# Patient Record
Sex: Male | Born: 1995 | Race: Black or African American | Hispanic: No | Marital: Single | Smoking: Never smoker
Health system: Southern US, Community
[De-identification: ages and names within clinical notes are randomized; demographics above are authoritative.]

## PROBLEM LIST (undated history)

## (undated) DIAGNOSIS — Z5189 Encounter for other specified aftercare: Secondary | ICD-10-CM

## (undated) DIAGNOSIS — R0902 Hypoxemia: Secondary | ICD-10-CM

## (undated) DIAGNOSIS — J45909 Unspecified asthma, uncomplicated: Secondary | ICD-10-CM

## (undated) DIAGNOSIS — G4730 Sleep apnea, unspecified: Secondary | ICD-10-CM

## (undated) DIAGNOSIS — T7840XA Allergy, unspecified, initial encounter: Secondary | ICD-10-CM

## (undated) HISTORY — DX: Encounter for other specified aftercare: Z51.89

## (undated) HISTORY — DX: Allergy, unspecified, initial encounter: T78.40XA

## (undated) HISTORY — DX: Unspecified asthma, uncomplicated: J45.909

## (undated) HISTORY — DX: Sleep apnea, unspecified: G47.30

## (undated) HISTORY — PX: ASD REPAIR: SHX258

## (undated) HISTORY — PX: LUNG BIOPSY: SHX232

## (undated) HISTORY — DX: Hypoxemia: R09.02

---
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
## 2015-01-18 DIAGNOSIS — G4733 Obstructive sleep apnea (adult) (pediatric): Secondary | ICD-10-CM | POA: Insufficient documentation

## 2015-01-18 DIAGNOSIS — R0609 Other forms of dyspnea: Secondary | ICD-10-CM | POA: Insufficient documentation

## 2015-01-18 DIAGNOSIS — R0600 Dyspnea, unspecified: Secondary | ICD-10-CM | POA: Insufficient documentation

## 2015-08-05 ENCOUNTER — Other Ambulatory Visit
Admission: RE | Admit: 2015-08-05 | Discharge: 2015-08-05 | Disposition: A | Discharge status: Home / Self Care | Payer: Medicaid Other | Source: Ambulatory Visit | Attending: Family Medicine | Admitting: Family Medicine

## 2015-08-05 DIAGNOSIS — R888 Abnormal findings in other body fluids and substances: Secondary | ICD-10-CM | POA: Diagnosis not present

## 2015-08-05 LAB — TSH: TSH: 3.59 u[IU]/mL (ref 0.350–4.500)

## 2015-08-06 LAB — T4: T4 TOTAL: 6.3 ug/dL (ref 4.5–12.0)

## 2015-11-22 ENCOUNTER — Other Ambulatory Visit
Admission: RE | Admit: 2015-11-22 | Discharge: 2015-11-22 | Disposition: A | Discharge status: Home / Self Care | Payer: Medicaid Other | Source: Ambulatory Visit | Attending: Family Medicine | Admitting: Family Medicine

## 2015-11-22 DIAGNOSIS — Z201 Contact with and (suspected) exposure to tuberculosis: Secondary | ICD-10-CM | POA: Diagnosis present

## 2015-11-25 LAB — QUANTIFERON IN TUBE
QFT TB AG MINUS NIL VALUE: <0 IU/mL
QUANTIFERON NIL VALUE: 0.05 [IU]/mL
QUANTIFERON TB AG VALUE: 0.04 [IU]/mL
QUANTIFERON TB GOLD: NEGATIVE

## 2015-11-25 LAB — QUANTIFERON TB GOLD ASSAY (BLOOD)

## 2016-05-28 IMAGING — CR DG CERVICAL SPINE WITH FLEX & EXTEND
1 series · 8 of 8 positions shown · non-contrast
Comparison: Cervical spine series of July 12, 2008

CLINICAL DATA: Down syndrome, evaluate for atlantoaxial
instability, chronic neck pain

EXAM:
CERVICAL SPINE COMPLETE WITH FLEXION AND EXTENSION VIEWS

[Series 1: dg cervical spine with flex & extend · 0.14mm/px · 8 of 8 slices shown]
[im 1/8]
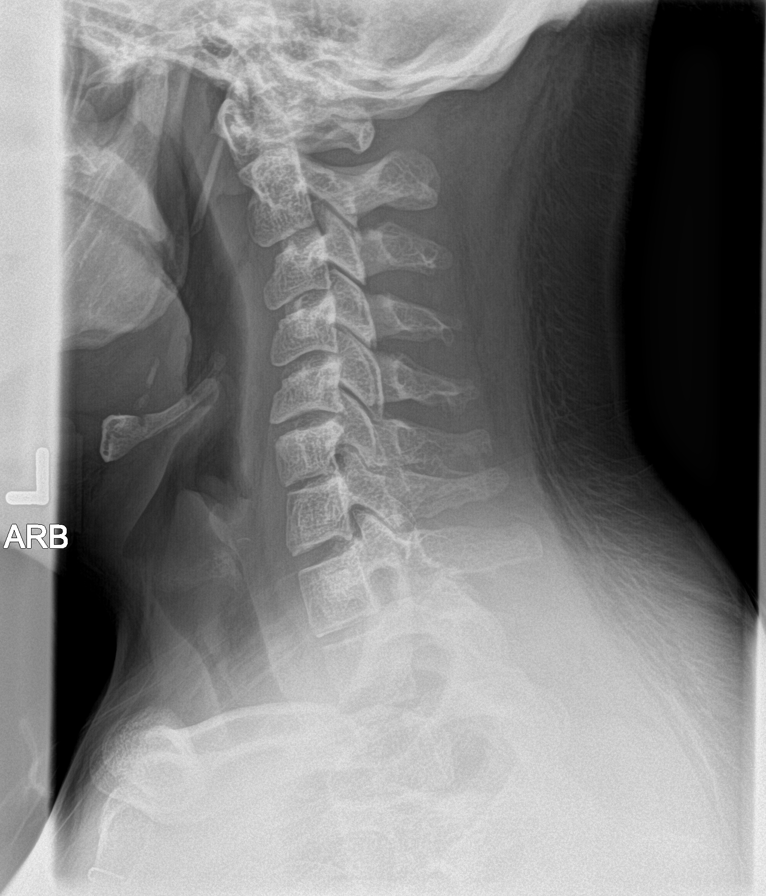
[im 2/8]
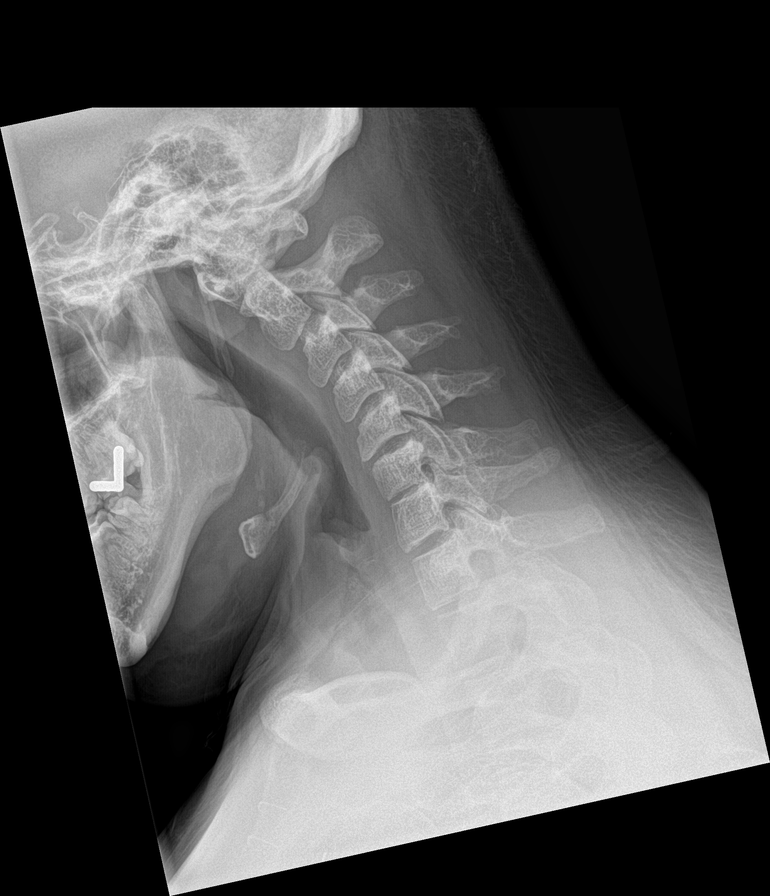
[im 3/8]
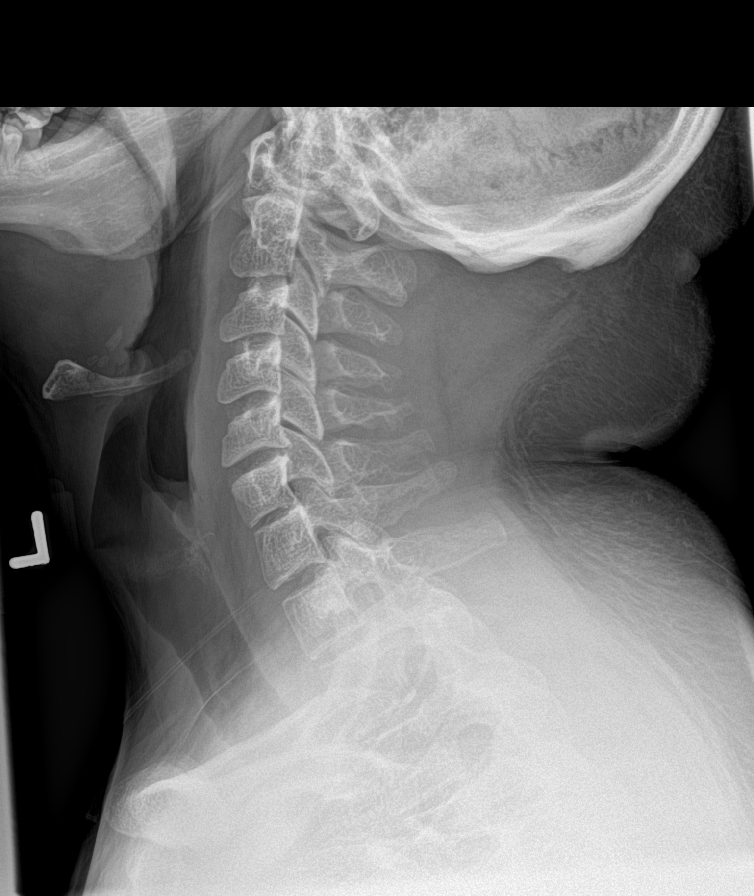
[im 4/8]
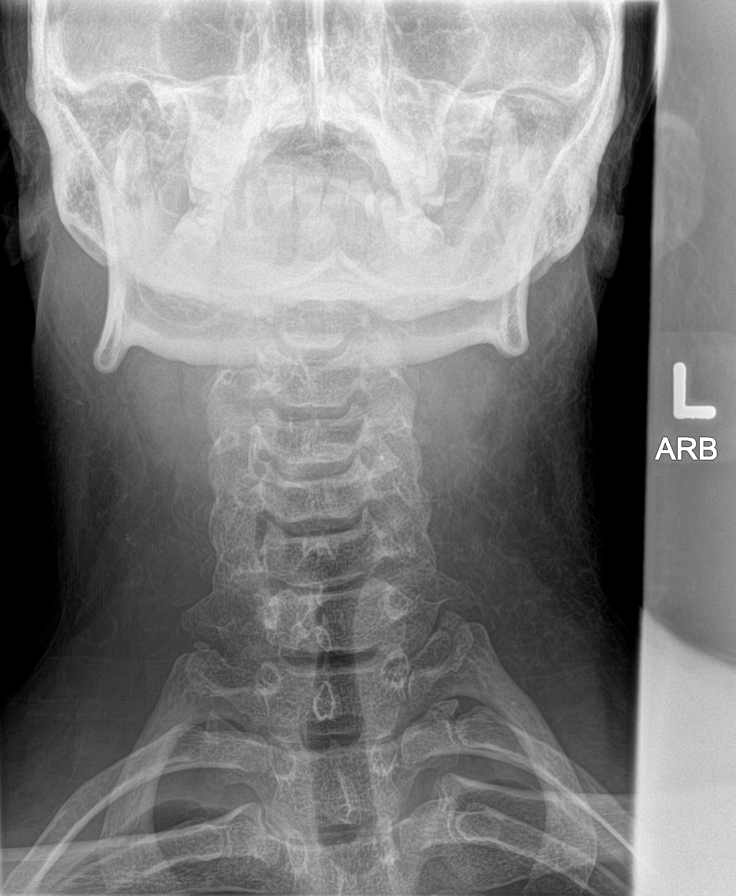
[im 5/8]
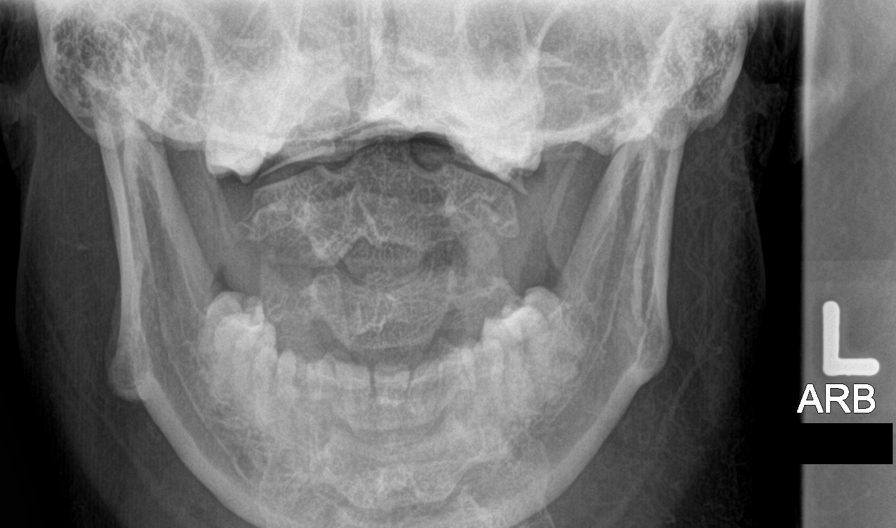
[im 6/8]
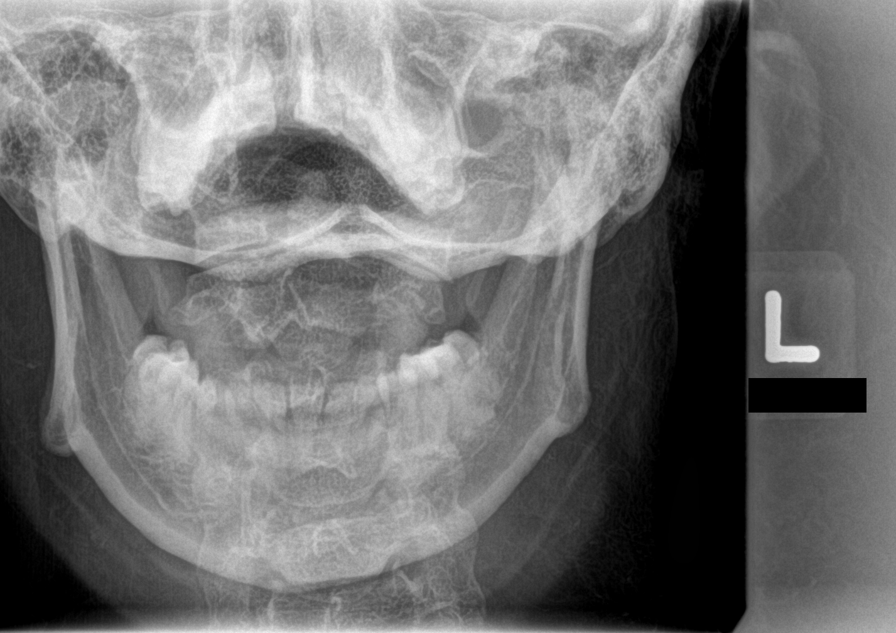
[im 7/8]
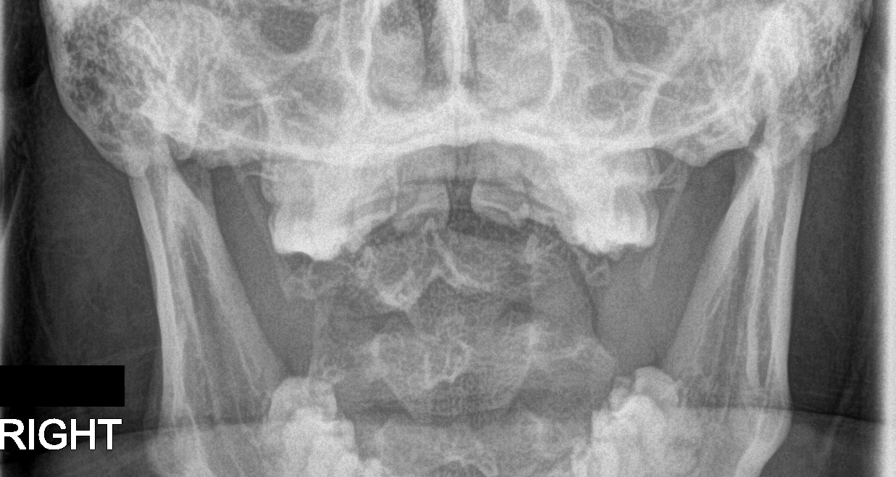
[im 8/8]
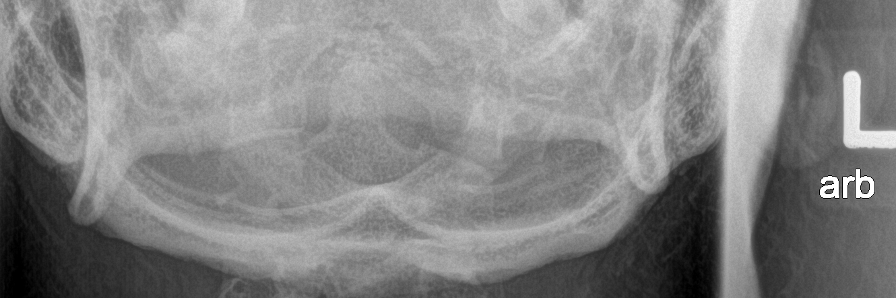

[8 of 8 positions shown; findings below may reference images not displayed]

FINDINGS: The cervical vertebral bodies are preserved in height. There is mild
disc space narrowing at C6-7. The prevertebral soft tissue spaces
are normal. There is no perched facet or spinous process fracture.
The odontoid is intact. As the patient moves from the neutral to the
flexed and to the extended position the atlanto dens interval is
stable.
IMPRESSION: 1. There is no evidence of atlantoaxial instability.
2. Mild chronic narrowing of the C6-7 disc space.
3. The vertebral bodies are preserved in height.

## 2019-11-11 ENCOUNTER — Other Ambulatory Visit: Payer: Self-pay | Admitting: Dermatology

## 2019-11-12 ENCOUNTER — Other Ambulatory Visit: Payer: Self-pay | Admitting: Family

## 2019-11-12 DIAGNOSIS — M542 Cervicalgia: Secondary | ICD-10-CM

## 2019-11-16 ENCOUNTER — Ambulatory Visit
Admission: RE | Admit: 2019-11-16 | Discharge: 2019-11-16 | Disposition: A | Discharge status: Home / Self Care | Payer: Medicaid Other | Source: Ambulatory Visit | Attending: Family | Admitting: Family

## 2019-11-16 ENCOUNTER — Ambulatory Visit
Admission: RE | Admit: 2019-11-16 | Discharge: 2019-11-16 | Disposition: A | Discharge status: Home / Self Care | Payer: Medicaid Other | Attending: Family | Admitting: Family

## 2019-11-16 ENCOUNTER — Other Ambulatory Visit: Payer: Self-pay

## 2019-11-16 DIAGNOSIS — M542 Cervicalgia: Secondary | ICD-10-CM | POA: Insufficient documentation

## 2020-02-29 NOTE — Progress Notes (Signed)
New patient visit   Patient: Kerry Graves   DOB: [birth year]   24 y.o. Male  MRN: [id number] Visit Date: 03/01/2020  Today's healthcare provider: Vernie Murders, PA   No chief complaint on file.  Subjective    Kerry Graves is a 24 y.o. male who presents today as a new patient to establish care.    Patient also has a rash or warts on his penis.  Past Medical History:  Diagnosis Date   Allergy    Asthma    Blood transfusion without reported diagnosis    Oxygen deficiency    Sleep apnea    There are no problems to display for this patient.  Family Status  Relation Name Status   Mother  (Not Specified)   Family History  Problem Relation Age of Onset   Hypertension Mother    Social History   Socioeconomic History   Marital status: Single    Spouse name: Not on file   Number of children: Not on file   Years of education: Not on file   Highest education level: Not on file  Occupational History   Not on file  Tobacco Use   Smoking status: Never Smoker  Substance and Sexual Activity   Alcohol use: Yes   Drug use: Yes   Sexual activity: Never  Other Topics Concern   Not on file  Social History Narrative   Not on file   Social Determinants of Health   Financial Resource Strain:    Difficulty of Paying Living Expenses: Not on file  Food Insecurity:    Worried About [place] in the Last Year: Not on file   Ran Out of Food in the Last Year: Not on file  Transportation Needs:    Lack of Transportation (Medical): Not on file   Lack of Transportation (Non-Medical): Not on file  Physical Activity:    Days of Exercise per Week: Not on file   Minutes of Exercise per Session: Not on file  Stress:    Feeling of Stress : Not on file  Social Connections:    Frequency of Communication with Friends and Family: Not on file   Frequency of Social Gatherings with Friends and Family: Not on file   Attends Religious  Services: Not on file   Active Member of Clubs or Organizations: Not on file   Attends Archivist Meetings: Not on file   Marital Status: Not on file   Outpatient Medications Prior to Visit  Medication Sig   albuterol (ACCUNEB) 0.63 MG/3ML nebulizer solution Take 1 ampule by nebulization every 6 (six) hours as needed for wheezing.   allopurinol (ZYLOPRIM) 100 MG tablet Take 100 mg by mouth daily.   budesonide (PULMICORT) 0.5 MG/2ML nebulizer solution Take 0.5 mg by nebulization 2 (two) times daily.   Cetirizine HCl 10 MG CAPS Take by mouth.   Fluocinolone Acetonide Body (DERMA-SMOOTHE/FS BODY) 0.01 % OIL Apply topically.   ketoconazole (NIZORAL) 2 % shampoo Apply 1 application topically 2 (two) times a week.   No facility-administered medications prior to visit.   Not on File  Immunization History  Administered Date(s) Administered   Moderna SARS-COVID-2 Vaccination 04/16/2019, 05/15/2019    Health Maintenance  Topic Date Due   Hepatitis C Screening  Never done   HIV Screening  Never done   TETANUS/TDAP  Never done   INFLUENZA VACCINE  Never done   COVID-19 Vaccine  Completed    Patient Care  Team: Yudit Modesitt, Vickki Muff, PA as PCP - General (Family Medicine)  Review of Systems  Constitutional: Negative.   HENT: Negative.   Eyes: Negative.   Respiratory: Negative.   Cardiovascular: Negative.   Gastrointestinal: Negative.   Endocrine: Negative.   Genitourinary: Negative.   Musculoskeletal: Negative.   Skin: Positive for rash.  Allergic/Immunologic: Positive for environmental allergies.  Neurological: Negative.   Hematological: Negative.   Psychiatric/Behavioral: Negative.       Objective    BP (!) 85/61 (BP Location: Right Arm, Patient Position: Sitting, Cuff Size: Normal)    Pulse 88    Temp 98.3 F (36.8 C) (Oral)    Wt 201 lb (91.2 kg)    SpO2 98%  Physical Exam Constitutional:      General: He is not in acute distress.    Appearance:  He is well-developed. He is obese.     Comments: Down's Syndrome congenital changes.  HENT:     Head: Normocephalic and atraumatic.     Right Ear: Hearing normal.     Left Ear: Hearing normal.     Nose: Nose normal.  Eyes:     General: Lids are normal. No scleral icterus.       Right eye: No discharge.        Left eye: No discharge.     Conjunctiva/sclera: Conjunctivae normal.  Cardiovascular:     Rate and Rhythm: Normal rate and regular rhythm.     Pulses: Normal pulses.     Heart sounds: Normal heart sounds.  Pulmonary:     Effort: Pulmonary effort is normal. No respiratory distress.     Breath sounds: Normal breath sounds.  Genitourinary:    Comments: Hard white crusting lesions on the shaft of the penis and around the glans. No local lymphadenopathy or urethral discharge. Musculoskeletal:        General: Normal range of motion.     Cervical back: Neck supple.  Skin:    Findings: No lesion or rash.     Comments: White-silvery raised lesion on the extensor surface of the left knee.  Neurological:     Mental Status: He is alert and oriented to person, place, and time.  Psychiatric:        Speech: Speech normal.        Behavior: Behavior normal.        Thought Content: Thought content normal.      Depression Screen No flowsheet data found. No results found for any visits on 03/01/20.  Assessment & Plan     1. Neoplasm, uncertain whether benign or malignant Lesions on the penis that are hard and questionable psoriatic versus squamous cell lesions. Schedule dermatology referral. - Ambulatory referral to Dermatology  2. Psoriasis Psoriasis of the left knee. Schedule dermatology referral. - Ambulatory referral to Dermatology  3. Obstructive sleep apnea Diagnosed several years ago and unable to tolerate CPAP. Controls oxygen saturation by use of O2 at 0.5 LPM at night by cannula. No significant snoring now.  4. 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  5. History of atrial septal defect Surgical repair of atrial septal defect at age 73-3. No murmur today.   No follow-ups on file.     Andres Shad, PA, have reviewed all documentation for this visit. The documentation on 03/01/20 for the exam, diagnosis, procedures, and orders are all accurate and complete.    Vernie Murders,  [place] [phone] (phone) [phone] (fax)  [place]

## 2020-03-01 ENCOUNTER — Other Ambulatory Visit: Payer: Self-pay

## 2020-03-01 ENCOUNTER — Encounter: Payer: Self-pay | Admitting: Family Medicine

## 2020-03-01 ENCOUNTER — Ambulatory Visit (INDEPENDENT_AMBULATORY_CARE_PROVIDER_SITE_OTHER): Payer: Medicaid Other | Admitting: Family Medicine

## 2020-03-01 VITALS — BP 85/61 | HR 88 | Temp 98.3°F | Wt 201.0 lb

## 2020-03-01 DIAGNOSIS — L409 Psoriasis, unspecified: Secondary | ICD-10-CM | POA: Diagnosis not present

## 2020-03-01 DIAGNOSIS — Z8679 Personal history of other diseases of the circulatory system: Secondary | ICD-10-CM

## 2020-03-01 DIAGNOSIS — D489 Neoplasm of uncertain behavior, unspecified: Secondary | ICD-10-CM

## 2020-03-01 DIAGNOSIS — Q909 Down syndrome, unspecified: Secondary | ICD-10-CM

## 2020-03-01 DIAGNOSIS — G4733 Obstructive sleep apnea (adult) (pediatric): Secondary | ICD-10-CM

## 2020-03-16 ENCOUNTER — Ambulatory Visit (INDEPENDENT_AMBULATORY_CARE_PROVIDER_SITE_OTHER): Payer: Medicaid Other | Admitting: Dermatology

## 2020-03-16 ENCOUNTER — Other Ambulatory Visit: Payer: Self-pay

## 2020-03-16 DIAGNOSIS — L409 Psoriasis, unspecified: Secondary | ICD-10-CM

## 2020-03-16 MED ORDER — CLOBETASOL PROPIONATE 0.05 % EX OINT: TOPICAL_OINTMENT | CUTANEOUS | 1 refills | Status: DC

## 2020-03-16 MED ORDER — FLUOCINOLONE ACETONIDE BODY 0.01 % EX OIL: TOPICAL_OIL | CUTANEOUS | 2 refills | Status: DC

## 2020-03-16 MED ORDER — TACROLIMUS 0.1 % EX OINT: TOPICAL_OINTMENT | CUTANEOUS | 1 refills | Status: DC

## 2020-03-16 MED ORDER — KETOCONAZOLE 2 % EX SHAM: MEDICATED_SHAMPOO | CUTANEOUS | 2 refills | Status: DC

## 2020-03-16 NOTE — Patient Instructions (Signed)
Topical steroids (such as triamcinolone, fluocinolone, fluocinonide, mometasone, clobetasol, halobetasol, betamethasone, hydrocortisone) can cause thinning and lightening of the skin if they are used for too long in the same area. Your physician has selected the right strength medicine for your problem and area affected on the body. Please use your medication only as directed by your physician to prevent side effects.   . 

## 2020-03-16 NOTE — Progress Notes (Signed)
   Follow-Up Visit   Subjective  Kerry Graves is a 24 y.o. male who presents for the following: skin check. Patient has scaling on his right knee present a couple of months. Not itchy, not painful. He also has scaling in ears and scalp. He has used ketoconazole shampoo and Derma-Smoothe oil, but don't help much.  He also has discoloration on the penis. His nurse that helps bathe him noticed it 1 1/2 weeks ago.  He does not have joint pain.    The following portions of the chart were reviewed this encounter and updated as appropriate:      Review of Systems:  No other skin or systemic complaints except as noted in HPI or Assessment and Plan.  Objective  Well appearing patient in no apparent distress; mood and affect are within normal limits.  A focused examination was performed including face, groin, knee. Relevant physical exam findings are noted in the Assessment and Plan.  Objective  Left Knee, glans penis, scalp, ears: Well-demarcated erythematous papules/plaques with silvery scale. Diffuse scaling on the scalp, ears.   Assessment & Plan  Psoriasis Left Knee, glans penis, scalp, ears  Plan to start [employer] pending labs and approval. Will send Rx to [place].   Start tacrolimus ointment BID to penis until improved.  Start clobetasol ointment QD/BID to affected area knee. Avoid face, groin, axilla.  Continue ketoconazole 2% shampoo massage into scalp and let sit several minutes before rinsing.  Continue Derma-Smoothe oil Apply to affected areas scalp, body qd/bid.  Topical steroids (such as triamcinolone, fluocinolone, fluocinonide, mometasone, clobetasol, halobetasol, betamethasone, hydrocortisone) can cause thinning and lightening of the skin if they are used for too long in the same area. Your physician has selected the right strength medicine for your problem and area affected on the body. Please use your medication only as directed by your physician to prevent side  effects.   Reviewed risks of biologics including immunosuppression, infections, injection site reaction, and failure to improve condition. Goal is control of skin condition, not cure.  Some older biologics such as Humira and Enbrel may slightly increase risk of malignancy and may worsen congestive heart failure. The use of biologics requires long term medication management, including periodic office visits and monitoring of blood work.   tacrolimus (PROTOPIC) 0.1 % ointment - Left Knee, glans penis, scalp, ears  clobetasol ointment (TEMOVATE) 0.05 % - Left Knee, glans penis, scalp, ears  ketoconazole (NIZORAL) 2 % shampoo - Left Knee, glans penis, scalp, ears  Fluocinolone Acetonide Body 0.01 % OIL - Left Knee, glans penis, scalp, ears  Other Related Procedures Comprehensive metabolic panel CBC with Differential/Platelet Hepatitis B surface antibody,qualitative Hepatitis B surface antigen Hepatitis C antibody HIV Antibody (routine testing w rflx) QuantiFERON-TB Gold Plus  Return in about 6 weeks (around 04/27/2020) for psoriasis.   IJamesetta Orleans, CMA, am acting as scribe for Brendolyn Patty, MD .  Documentation: I have reviewed the above documentation for accuracy and completeness, and I agree with the above.  Brendolyn Patty MD

## 2020-03-17 ENCOUNTER — Other Ambulatory Visit: Payer: Self-pay

## 2020-03-17 MED ORDER — PROTOPIC 0.1 % EX OINT: TOPICAL_OINTMENT | Freq: Two times a day (BID) | CUTANEOUS | 1 refills | Status: DC

## 2020-03-17 MED ORDER — DERMA-SMOOTHE/FS BODY 0.01 % EX OIL: 1.0000 "application " | TOPICAL_OIL | CUTANEOUS | 2 refills | Status: DC

## 2020-03-17 NOTE — Progress Notes (Signed)
RXs sent in as DAW for insurance purposes.

## 2020-03-18 LAB — COMPREHENSIVE METABOLIC PANEL
ALT: 22 IU/L (ref 0–44)
AST: 18 IU/L (ref 0–40)
Albumin/Globulin Ratio: 1.2 (ref 1.2–2.2)
Albumin: 4.3 g/dL (ref 4.1–5.2)
Alkaline Phosphatase: 74 IU/L (ref 44–121)
BUN/Creatinine Ratio: 11 (ref 9–20)
BUN: 13 mg/dL (ref 6–20)
Bilirubin Total: 0.8 mg/dL (ref 0.0–1.2)
CO2: 26 mmol/L (ref 20–29)
Calcium: 9.3 mg/dL (ref 8.7–10.2)
Chloride: 99 mmol/L (ref 96–106)
Creatinine, Ser: 1.15 mg/dL (ref 0.76–1.27)
GFR calc Af Amer: 102 mL/min/{1.73_m2} (ref >59)
GFR calc non Af Amer: 89 mL/min/{1.73_m2} (ref >59)
Globulin, Total: 3.5 g/dL (ref 1.5–4.5)
Glucose: 77 mg/dL (ref 65–99)
Potassium: 4.1 mmol/L (ref 3.5–5.2)
Sodium: 139 mmol/L (ref 134–144)
Total Protein: 7.8 g/dL (ref 6.0–8.5)

## 2020-03-18 LAB — HEPATITIS B SURFACE ANTIBODY,QUALITATIVE: Hep B Surface Ab, Qual: NONREACTIVE

## 2020-03-18 LAB — CBC WITH DIFFERENTIAL/PLATELET
Basophils Absolute: 0 10*3/uL (ref 0.0–0.2)
Basos: 0 %
EOS (ABSOLUTE): 0 10*3/uL (ref 0.0–0.4)
Eos: 0 %
Hematocrit: 51.1 % — ABNORMAL HIGH (ref 37.5–51.0)
Hemoglobin: 17.5 g/dL (ref 13.0–17.7)
Immature Grans (Abs): 0 10*3/uL (ref 0.0–0.1)
Immature Granulocytes: 0 %
Lymphocytes Absolute: 2.6 10*3/uL (ref 0.7–3.1)
Lymphs: 51 %
MCH: 31.8 pg (ref 26.6–33.0)
MCHC: 34.2 g/dL (ref 31.5–35.7)
MCV: 93 fL (ref 79–97)
Monocytes Absolute: 0.5 10*3/uL (ref 0.1–0.9)
Monocytes: 10 %
Neutrophils Absolute: 2 10*3/uL (ref 1.4–7.0)
Neutrophils: 39 %
Platelets: 212 10*3/uL (ref 150–450)
RBC: 5.51 x10E6/uL (ref 4.14–5.80)
RDW: 13.6 % (ref 11.6–15.4)
WBC: 5.1 10*3/uL (ref 3.4–10.8)

## 2020-03-18 LAB — QUANTIFERON-TB GOLD PLUS
QuantiFERON Mitogen Value: >10 IU/mL
QuantiFERON Nil Value: 0.09 IU/mL
QuantiFERON TB1 Ag Value: 0.07 IU/mL
QuantiFERON TB2 Ag Value: 0.09 IU/mL
QuantiFERON-TB Gold Plus: NEGATIVE

## 2020-03-18 LAB — HEPATITIS C ANTIBODY: Hep C Virus Ab: 0.3 s/co ratio (ref 0.0–0.9)

## 2020-03-18 LAB — HIV ANTIBODY (ROUTINE TESTING W REFLEX): HIV Screen 4th Generation wRfx: NONREACTIVE

## 2020-03-18 LAB — HEPATITIS B SURFACE ANTIGEN: Hepatitis B Surface Ag: NEGATIVE

## 2020-03-21 ENCOUNTER — Telehealth: Payer: Self-pay

## 2020-03-21 MED ORDER — SKYRIZI PEN 150 MG/ML ~~LOC~~ SOAJ: 150.0000 mg | SUBCUTANEOUS | 1 refills | Status: DC

## 2020-03-21 MED ORDER — SKYRIZI PEN 150 MG/ML ~~LOC~~ SOAJ: 150.0000 mg | SUBCUTANEOUS | 3 refills | Status: DC

## 2020-03-21 NOTE — Telephone Encounter (Signed)
-----   Message from Brendolyn Patty, MD sent at 03/20/2020  8:40 PM EST ----- Labs ok, Hep panel/HIV/TB negative Ok to send in Rx for [employer]

## 2020-03-21 NOTE — Telephone Encounter (Signed)
Advised pts mother of lab results.  Advisd we would send in [place] to [facility]

## 2020-03-23 ENCOUNTER — Telehealth: Payer: Self-pay

## 2020-03-23 NOTE — Telephone Encounter (Signed)
Patient's Orson Ape will not be approved due to [employer]. Patient will have to try and fail Humira, Enbrel or Cosentyx first. Spoke with Darrick Penna at [facility] and she states there is no back and forth with [facility] unfortunately.

## 2020-03-24 ENCOUNTER — Other Ambulatory Visit: Payer: Self-pay

## 2020-03-24 MED ORDER — PROTOPIC 0.1 % EX OINT: TOPICAL_OINTMENT | Freq: Two times a day (BID) | CUTANEOUS | 1 refills | Status: DC

## 2020-03-24 NOTE — Telephone Encounter (Signed)
I would prefer Cosentyx of those three choices.  But it requires more frequent injections (maintenance is 2 shots every month).  Can you contact mother to see if he would be okay with that dosing schedule?  If so, we can proceed with the Cosentyx

## 2020-03-29 NOTE — Telephone Encounter (Signed)
Left patient's mother message to return my call.

## 2020-04-13 NOTE — Telephone Encounter (Signed)
Called mom and spoke with her today but she could not talk at the time due to patient possibly having a positive COVId test.

## 2020-05-03 ENCOUNTER — Ambulatory Visit: Payer: Medicaid Other | Admitting: Dermatology

## 2020-06-03 ENCOUNTER — Encounter: Payer: Self-pay | Admitting: Adult Health

## 2020-06-03 ENCOUNTER — Ambulatory Visit (INDEPENDENT_AMBULATORY_CARE_PROVIDER_SITE_OTHER): Payer: Medicaid Other | Admitting: Adult Health

## 2020-06-03 ENCOUNTER — Other Ambulatory Visit: Payer: Self-pay

## 2020-06-03 ENCOUNTER — Ambulatory Visit
Admission: RE | Admit: 2020-06-03 | Discharge: 2020-06-03 | Disposition: A | Discharge status: Home / Self Care | Payer: Medicaid Other | Attending: Adult Health | Admitting: Adult Health

## 2020-06-03 ENCOUNTER — Ambulatory Visit
Admission: RE | Admit: 2020-06-03 | Discharge: 2020-06-03 | Disposition: A | Discharge status: Home / Self Care | Payer: Medicaid Other | Source: Ambulatory Visit | Attending: Adult Health | Admitting: Adult Health

## 2020-06-03 DIAGNOSIS — M47812 Spondylosis without myelopathy or radiculopathy, cervical region: Secondary | ICD-10-CM | POA: Diagnosis not present

## 2020-06-03 DIAGNOSIS — R0600 Dyspnea, unspecified: Secondary | ICD-10-CM

## 2020-06-03 DIAGNOSIS — L409 Psoriasis, unspecified: Secondary | ICD-10-CM | POA: Diagnosis not present

## 2020-06-03 DIAGNOSIS — M542 Cervicalgia: Secondary | ICD-10-CM | POA: Diagnosis present

## 2020-06-03 DIAGNOSIS — Q909 Down syndrome, unspecified: Secondary | ICD-10-CM | POA: Diagnosis not present

## 2020-06-03 DIAGNOSIS — I959 Hypotension, unspecified: Secondary | ICD-10-CM

## 2020-06-03 DIAGNOSIS — R0609 Other forms of dyspnea: Secondary | ICD-10-CM

## 2020-06-03 DIAGNOSIS — Q212 Atrioventricular septal defect: Secondary | ICD-10-CM

## 2020-06-03 DIAGNOSIS — Q2121 Partial atrioventricular septal defect: Secondary | ICD-10-CM

## 2020-06-03 DIAGNOSIS — Z87898 Personal history of other specified conditions: Secondary | ICD-10-CM | POA: Insufficient documentation

## 2020-06-03 MED ORDER — BUDESONIDE 0.5 MG/2ML IN SUSP: 0.5000 mg | Freq: Two times a day (BID) | RESPIRATORY_TRACT | 0 refills | Status: DC
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
## 2020-06-06 ENCOUNTER — Other Ambulatory Visit: Payer: Self-pay

## 2020-06-06 ENCOUNTER — Encounter: Payer: Self-pay | Admitting: Cardiology

## 2020-06-06 ENCOUNTER — Ambulatory Visit (INDEPENDENT_AMBULATORY_CARE_PROVIDER_SITE_OTHER): Payer: Medicaid Other | Admitting: Cardiology

## 2020-06-06 VITALS — BP 98/60 | HR 54 | Wt 200.0 lb

## 2020-06-06 DIAGNOSIS — Q909 Down syndrome, unspecified: Secondary | ICD-10-CM

## 2020-06-06 DIAGNOSIS — Q212 Atrioventricular septal defect: Secondary | ICD-10-CM

## 2020-06-06 DIAGNOSIS — Q2121 Partial atrioventricular septal defect: Secondary | ICD-10-CM

## 2020-06-06 NOTE — Patient Instructions (Signed)
Medication Instructions:  Your physician recommends that you continue on your current medications as directed. Please refer to the Current Medication list given to you today.  *If you need a refill on your cardiac medications before your next appointment, please call your pharmacy*  Follow-Up: At [facility], you and your health needs are our priority.  As part of our continuing mission to provide you with exceptional heart care, we have created designated Provider Care Teams.  These Care Teams include your primary Cardiologist (physician) and Advanced Practice Providers (APPs -  Physician Assistants and Nurse Practitioners) who all work together to provide you with the care you need, when you need it.  We recommend signing up for the patient portal called "MyChart".  Sign up information is provided on this After Visit Summary.  MyChart is used to connect with patients for Virtual Visits (Telemedicine).  Patients are able to view lab/test results, encounter notes, upcoming appointments, etc.  Non-urgent messages can be sent to your provider as well.   To learn more about what you can do with MyChart, go to [email or web address].    Your next appointment:   As needed.  The format for your next appointment:   In Person  Provider:   Kate Sable, MD

## 2020-06-06 NOTE — Progress Notes (Signed)
Degenerative changes C5- C6 and C6 - C7 with reversal of cervical curve sometimes seen with muscle spasms.  This x ray similar to previous 6 months ago. Ok to refer to orthopedics if wanted.

## 2020-06-06 NOTE — Progress Notes (Signed)
Cardiology Office Note:    Date:  06/06/2020   ID:  Kerry Graves, DOB 03-11-1996, MRN [id number]  PCP:  Chrismon, Dennis E, [place]  Cardiologist:  No primary care provider on file.  Advanced Practice Provider:  No care team member to display Electrophysiologist:  None       Referring MD: Sharmon Leyden*   Chief Complaint  Patient presents with  . New Patient (Initial Visit)    Establish care with provider for DOE, AVSD and hypotension. Medications verbally reviewed with patient and caregiver.    History of Present Illness:    Kerry Graves is a 25 y.o. male with a hx of OSA, asthma, trisomy 21, partial AV septal defect, ASD s/p primary ASD closure who presents to establish care.  He presents today with caretaker.  States having AV canal defect which was repaired around age 34.  Has been okay over the past several years.  Denies any more procedures since then.  Has put on some weight due to lack of exercising since the pandemic.  Patient previously was followed/seen at [facility].  EMR/reports indicate patient has a known cleft of mitral and tricuspid valve.  Last echo 2016 at [facility] showed EF 50%, mild TR, trivial MR.  Patient has no concerns at this time, his blood pressures are usually low normal.  Past Medical History:  Diagnosis Date  . Allergy   . Asthma   . Blood transfusion without reported diagnosis   . Oxygen deficiency   . Sleep apnea     Past Surgical History:  Procedure Laterality Date  . ASD REPAIR    . LUNG BIOPSY      Current Medications: Current Meds  Medication Sig  . albuterol (ACCUNEB) 0.63 MG/3ML nebulizer solution Take 1 ampule by nebulization every 6 (six) hours as needed for wheezing.  Marland Kitchen allopurinol (ZYLOPRIM) 100 MG tablet Take 100 mg by mouth daily.  . budesonide (PULMICORT) 0.5 MG/2ML nebulizer solution Take 2 mLs (0.5 mg total) by nebulization 2 (two) times daily.  . Cetirizine HCl 10 MG  CAPS Take by mouth.  . clobetasol ointment (TEMOVATE) 0.05 % Apply to affected area knee twice daily until improved. Avoid face, groin, underarms.  . DERMA-SMOOTHE/FS BODY 0.01 % OIL Apply 1 application topically as directed. Apply once to twice daily, do not rinse off  . fluticasone (FLONASE) 50 MCG/ACT nasal spray Place 1 spray into both nostrils daily.  Marland Kitchen ketoconazole (NIZORAL) 2 % shampoo Massage into scalp 1-2x/wk, let sit several minutes until improved.  Marland Kitchen omeprazole (PRILOSEC) 20 MG capsule Take 20 mg by mouth daily.  Marland Kitchen PROAIR HFA 108 (90 Base) MCG/ACT inhaler SMARTSIG:1 Puff(s) By Mouth Every 6 Hours PRN  . PROTOPIC 0.1 % ointment Apply topically 2 (two) times daily. To affected area of groin until improved     Allergies:   Patient has no known allergies.   Social History   Socioeconomic History  . Marital status: Single    Spouse name: Not on file  . Number of children: Not on file  . Years of education: Not on file  . Highest education level: Not on file  Occupational History  . Not on file  Tobacco Use  . Smoking status: Never Smoker  . Smokeless tobacco: Never Used  Substance and Sexual Activity  . Alcohol use: Not Currently  . Drug use: Yes  . Sexual activity: Never  Other Topics Concern  . Not on  file  Social History Narrative  . Not on file   Social Determinants of Health   Financial Resource Strain: Not on file  Food Insecurity: Not on file  Transportation Needs: Not on file  Physical Activity: Not on file  Stress: Not on file  Social Connections: Not on file     Family History: The patient's family history includes Hypertension in his mother.  ROS:   Please see the history of present illness.     All other systems reviewed and are negative.  EKGs/Labs/Other Studies Reviewed:    The following studies were reviewed today:   EKG:  EKG is  ordered today.  The ekg ordered today demonstrates sinus bradycardia, PACs  Recent Labs: 03/16/2020: ALT 22;  BUN 13; Creatinine, Ser 1.15; Hemoglobin 17.5; Platelets 212; Potassium 4.1; Sodium 139  Recent Lipid Panel No results found for: CHOL, TRIG, HDL, CHOLHDL, VLDL, LDLCALC, LDLDIRECT   Risk Assessment/Calculations:      Physical Exam:    VS:  BP 98/60 (BP Location: Left Arm, Patient Position: Sitting, Cuff Size: Normal)   Pulse (!) 54   Wt 200 lb (90.7 kg)   SpO2 97%     Wt Readings from Last 3 Encounters:  06/06/20 200 lb (90.7 kg)  06/03/20 203 lb (92.1 kg)  03/01/20 201 lb (91.2 kg)     GEN:  Well nourished, well developed in no acute distress HEENT: Normal NECK: No JVD; No carotid bruits LYMPHATICS: No lymphadenopathy CARDIAC: RRR, no murmurs, distant heart sounds RESPIRATORY: Clear anteriorly, decreased at bases ABDOMEN: Soft, non-tender, non-distended MUSCULOSKELETAL:  No edema; No deformity  SKIN: Warm and dry NEUROLOGIC:  Alert  PSYCHIATRIC:  Normal affect   ASSESSMENT:    1. Atrioventricular septal defect (AVSD), partial   2. Trisomy 21, Down syndrome    PLAN:    In order of problems listed above:  1. Patient with trisomy 56, AV canal defect status post apparent primum ASD repair around age 68.  Was lost to follow-up [facility].  Clinically he does not appear to be in heart failure, no murmurs, edema noted on exam.  Recommend patient reestablish care with [facility] clinic as this is the most skilled for his condition and monitoring the area.  Also advantageous due to continuity of care.  No indication for additional testing at this time.  Repeat echocardiogram can be performed when he reestablishes with [facility] clinic.  Follow-up as needed    Medication Adjustments/Labs and Tests Ordered: Current medicines are reviewed at length with the patient today.  Concerns regarding medicines are outlined above.  Orders Placed This Encounter  Procedures  . EKG 12-Lead   No orders of the defined types were placed in this encounter.   Patient Instructions   Medication Instructions:  Your physician recommends that you continue on your current medications as directed. Please refer to the Current Medication list given to you today.  *If you need a refill on your cardiac medications before your next appointment, please call your pharmacy*  Follow-Up: At [facility], you and your health needs are our priority.  As part of our continuing mission to provide you with exceptional heart care, we have created designated Provider Care Teams.  These Care Teams include your primary Cardiologist (physician) and Advanced Practice Providers (APPs -  Physician Assistants and Nurse Practitioners) who all work together to provide you with the care you need, when you need it.  We recommend signing up for the patient portal called "MyChart".  Sign up information is provided on this After Visit Summary.  MyChart is used to connect with patients for Virtual Visits (Telemedicine).  Patients are able to view lab/test results, encounter notes, upcoming appointments, etc.  Non-urgent messages can be sent to your provider as well.   To learn more about what you can do with MyChart, go to [email or web address].    Your next appointment:   As needed.  The format for your next appointment:   In Person  Provider:   Kate Sable, MD     Signed, Kate Sable, MD  06/06/2020 4:57 PM    [place]

## 2020-06-27 ENCOUNTER — Telehealth: Payer: Self-pay

## 2020-06-27 DIAGNOSIS — L409 Psoriasis, unspecified: Secondary | ICD-10-CM

## 2020-06-27 MED ORDER — OTEZLA 10 & 20 & 30 MG PO TBPK: ORAL_TABLET | ORAL | 0 refills | Status: DC

## 2020-06-27 MED ORDER — OTEZLA 30 MG PO TABS: 30.0000 mg | ORAL_TABLET | Freq: Two times a day (BID) | ORAL | 1 refills | Status: DC

## 2020-06-27 NOTE — Telephone Encounter (Signed)
Spoke with patient's mother. He was not able to get Orson Ape and she states that Dr Nicole Kindred talked to her about an oral treatment at his last appointment if Orson Ape was not covered. I did not see mention of Otezla in his chart. However, there is a note that Dr Nicole Kindred would like him to try Cosentyx and Estill Bamberg tried to reach out to his mother regarding this and she could not talk at the time. The patient did not come to his 6 week follow up appointment scheduled in January 2022. His mother would like to have the oral treatment sent in and she said she would schedule a follow up appointment in the future once he has started on the pills. Please advise.

## 2020-06-27 NOTE — Telephone Encounter (Signed)
Go ahead and send in Otezla 30 mg PO bid, #60.  To [facility]?  Not sure if it is covered either, but we can send it in and see.  He can start with a sample pack if she wants to come by and pick it up.  GI upset frequently occurs when first starting medication and gets better as body gets used to it. The sample pack starts with a lower dose to minimize GI side effects. Side effects of Otezla (apremilast) include diarrhea, nausea, headache, upper respiratory infection, depression, and weight decrease (5-10%). Goal is control of skin condition, not cure.  The use of Rutherford Nail requires long term medication management, including periodic office visits.  Needs follow-up apt 4-6 weeks after starting medication.

## 2020-06-27 NOTE — Telephone Encounter (Signed)
Sent in [place] to [place]. Advised caregiver that may or may not be covered by his insurance. Discussed the risk and side effects of medication and advised that he may have some GI upset when starting medication that should, hopefully, improve as his system gets used to the medication. Advised caregiver if Rutherford Nail is not covered, he will need to try and fail Enbrel, Humira or Cosentyx.

## 2020-07-13 ENCOUNTER — Other Ambulatory Visit: Payer: Self-pay

## 2020-07-13 ENCOUNTER — Ambulatory Visit (INDEPENDENT_AMBULATORY_CARE_PROVIDER_SITE_OTHER): Payer: Medicaid Other | Admitting: Dermatology

## 2020-07-13 DIAGNOSIS — L853 Xerosis cutis: Secondary | ICD-10-CM

## 2020-07-13 DIAGNOSIS — L739 Follicular disorder, unspecified: Secondary | ICD-10-CM

## 2020-07-13 DIAGNOSIS — L409 Psoriasis, unspecified: Secondary | ICD-10-CM

## 2020-07-13 MED ORDER — CLINDAMYCIN PHOSPHATE 1 % EX SOLN: CUTANEOUS | 3 refills | Status: DC

## 2020-07-13 MED ORDER — DERMA-SMOOTHE/FS BODY 0.01 % EX OIL: 1.0000 "application " | TOPICAL_OIL | CUTANEOUS | 3 refills | Status: DC

## 2020-07-13 MED ORDER — CLOBETASOL PROPIONATE 0.05 % EX OINT: TOPICAL_OINTMENT | CUTANEOUS | 2 refills | Status: DC

## 2020-07-13 MED ORDER — CLINDAMYCIN PHOSPHATE 1 % EX LOTN: TOPICAL_LOTION | CUTANEOUS | 3 refills | Status: DC

## 2020-07-13 NOTE — Patient Instructions (Addendum)
Patient will have to try and fail Humira, Enbrel or Cosentyx first (injections) before insurance will pay for Rutherford Nail (pills).   Continue Derma-Smoothe F/S oil 1-2 times a day to scalp/body. Continue Ketoconazole 2% shampoo - massage into scalp and let sit several minutes before rinsing. Continue clobetasol ointment to affected area knee and scalp 1-2 times a day until rash improved. Avoid face, groin, underarms. Continue Protopic (tacrolimus) ointment 1-2 times a day to affected areas groin and behind ears until rash improved.  Start clindamycin lotion - apply to chest and back every day after shower for folliculitis (bumps).  Topical steroids (such as triamcinolone, fluocinolone, fluocinonide, mometasone, clobetasol, halobetasol, betamethasone, hydrocortisone) can cause thinning and lightening of the skin if they are used for too long in the same area. Your physician has selected the right strength medicine for your problem and area affected on the body. Please use your medication only as directed by your physician to prevent side effects.    If you have any questions or concerns for your doctor, please call our main line at [phone] and press option 4 to reach your doctor's medical assistant. If no one answers, please leave a voicemail as directed and we will return your call as soon as possible. Messages left after 4 pm will be answered the following business day.   You may also send [place] a message via [place]. We typically respond to MyChart messages within 1-2 business days.  For prescription refills, please ask your pharmacy to contact our office. Our fax number is [phone].  If you have an urgent issue when the clinic is closed that cannot wait until the next business day, you can page your doctor at the number below.    Please note that while we do our best to be available for urgent issues outside of office hours, we are not available 24/7.   If you have an urgent issue and are  unable to reach [place], you may choose to seek medical care at your doctor's office, retail clinic, urgent care center, or emergency room.  If you have a medical emergency, please immediately call 911 or go to the emergency department.  Pager Numbers  - Dr. Nehemiah Massed: [phone]  - Dr. Laurence Ferrari: [phone]  - Dr. Nicole Kindred: [phone]  In the event of inclement weather, please call our main line at [phone] for an update on the status of any delays or closures.  Dermatology Medication Tips: Please keep the boxes that topical medications come in in order to help keep track of the instructions about where and how to use these. Pharmacies typically print the medication instructions only on the boxes and not directly on the medication tubes.   If your medication is too expensive, please contact our office at [phone] option 4 or send [place] a message through [place].   We are unable to tell what your co-pay for medications will be in advance as this is different depending on your insurance coverage. However, we may be able to find a substitute medication at lower cost or fill out paperwork to get insurance to cover a needed medication.   If a prior authorization is required to get your medication covered by your insurance company, please allow [place] 1-2 business days to complete this process.  Drug prices often vary depending on where the prescription is filled and some pharmacies may offer cheaper prices.  The website www.goodrx.com contains coupons for medications through different pharmacies. The prices here do not account for what the cost  may be with help from insurance (it may be cheaper with your insurance), but the website can give you the price if you did not use any insurance.  - You can print the associated coupon and take it with your prescription to the pharmacy.  - You may also stop by our office during regular business hours and pick up a GoodRx coupon card.  - If you need your  prescription sent electronically to a different pharmacy, notify our office through [facility] or by phone at [phone] option 4.

## 2020-07-13 NOTE — Progress Notes (Signed)
Follow-Up Visit   Subjective  Kerry Graves is a 25 y.o. male who presents for the following: Psoriasis (Patient here for follow-up psoriasis of the scalp, knee, and groin. He bathes in Hibiclens, uses Derma-Smoothe F/S oil to the scalp 2-3 times a week, and uses ketoconazole 2% shampoo 1x/wk to the scalp. Clobetasol ointment is used once daily to the knee and Protopic ointment qd/bid to the groin area.). Patient's mother does not want patient to go on biologics at this time.  Patient presents today with caretaker who contributes to history.   The following portions of the chart were reviewed this encounter and updated as appropriate:       Review of Systems:  No other skin or systemic complaints except as noted in HPI or Assessment and Plan.  Objective  Well appearing patient in no apparent distress; mood and affect are within normal limits.  A focused examination was performed including face, scalp, knee, groin. Relevant physical exam findings are noted in the Assessment and Plan.  Objective  Scalp: Pink scaly papules and patches on temporal scalp bil, frontal hairline, post auricular, left knee  Objective  Back, chest: Scattered pink papules and hyperpigmented macules and papules of the back and chest.   Assessment & Plan   Xerosis - diffuse xerotic patches - recommend gentle, hydrating skin care - gentle skin care handout given   Psoriasis Scalp  Improving with topical treatment  Discussed biologics that patient would need to try and fail before Rutherford Nail would be approved. Caretaker states that mother declines biologics at this time. May consider in the future if worsens.  Continue Derma-Smoothe F/S oil qd/bid scalp/body. Continue Ketoconazole 2% shampoo Massage into scalp and let sit several minutes before rinsing. Continue Clobetasol ointment qd/bid AA knee and scalp prn flares. Continue Tacrolimus ointment qd/bid penis and post auricular prn.   Psoriasis  is a chronic non-curable, but treatable genetic/hereditary disease that may have other systemic features affecting other organ systems such as joints (Psoriatic Arthritis). It is associated with an increased risk of inflammatory bowel disease, heart disease, non-alcoholic fatty liver disease, and depression.     Topical steroids (such as triamcinolone, fluocinolone, fluocinonide, mometasone, clobetasol, halobetasol, betamethasone, hydrocortisone) can cause thinning and lightening of the skin if they are used for too long in the same area. Your physician has selected the right strength medicine for your problem and area affected on the body. Please use your medication only as directed by your physician to prevent side effects.    Reordered Medications clobetasol ointment (TEMOVATE) 0.05 %  Other Related Medications ketoconazole (NIZORAL) 2 % shampoo Apremilast (OTEZLA) 10 & 20 & 30 MG TBPK Apremilast (OTEZLA) 30 MG TABS Apremilast (OTEZLA) 30 MG TABS  Folliculitis Back, chest  Continue Hibiclens in the shower qd. Caregiver reports they have tried BPO wash in past and it had to be changed, not sure why.  Start clindamycin solution Apply to chest and back qd after shower dsp 61mL 3Rf. (lotion not covered by insurance)   clindamycin (CLEOCIN T) 1 % external solution - Back, chest  Xerosis - diffuse xerotic patches - recommend gentle, hydrating skin care - gentle skin care handout given Recommend mild soap and moisturizing cream 1-2 times daily.    Return in about 6 months (around 01/13/2021) for psoriasis.   Lindi Adie, CMA, am acting as scribe for Brendolyn Patty, MD .  Documentation: I have reviewed the above documentation for accuracy and completeness, and I agree with the  above.  Brendolyn Patty MD

## 2020-07-28 ENCOUNTER — Telehealth: Payer: Self-pay

## 2020-07-28 NOTE — Telephone Encounter (Signed)
Medicaid called regarding appeal they received from [facility].   Medicaid does not initiate appeals unless a parent or guardian does first. Patient's mother would need to complete the "Hearing request form" or call [phone] to start this.   I know from previous experience this mother is very hard to get in touch with.

## 2020-08-01 NOTE — Telephone Encounter (Signed)
Please try to contact mother to let her know that she needs to initiate the med appeal.

## 2020-08-01 NOTE — Telephone Encounter (Signed)
Left message on voicemail to return my call.  

## 2020-08-03 NOTE — Telephone Encounter (Signed)
Patient's mother informed and telephone number given.

## 2020-12-08 ENCOUNTER — Telehealth: Payer: Self-pay | Admitting: Family Medicine
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
## 2020-12-09 ENCOUNTER — Encounter: Payer: Self-pay | Admitting: Family Medicine

## 2020-12-09 ENCOUNTER — Telehealth (INDEPENDENT_AMBULATORY_CARE_PROVIDER_SITE_OTHER): Payer: Medicaid Other | Admitting: Family Medicine

## 2020-12-09 DIAGNOSIS — M542 Cervicalgia: Secondary | ICD-10-CM | POA: Diagnosis not present

## 2020-12-09 DIAGNOSIS — Q909 Down syndrome, unspecified: Secondary | ICD-10-CM
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
## 2020-12-12 ENCOUNTER — Ambulatory Visit
Admission: RE | Admit: 2020-12-12 | Discharge: 2020-12-12 | Disposition: A | Discharge status: Home / Self Care | Payer: Medicaid Other | Source: Ambulatory Visit | Attending: Family Medicine | Admitting: Family Medicine

## 2020-12-12 ENCOUNTER — Ambulatory Visit
Admission: RE | Admit: 2020-12-12 | Discharge: 2020-12-12 | Disposition: A | Discharge status: Home / Self Care | Payer: Medicaid Other | Attending: Family Medicine | Admitting: Family Medicine

## 2020-12-12 DIAGNOSIS — Q909 Down syndrome, unspecified: Secondary | ICD-10-CM | POA: Insufficient documentation

## 2020-12-12 DIAGNOSIS — M542 Cervicalgia: Secondary | ICD-10-CM | POA: Insufficient documentation

## 2020-12-27 ENCOUNTER — Ambulatory Visit: Payer: Medicaid Other | Admitting: Dermatology

## 2021-02-08 ENCOUNTER — Other Ambulatory Visit: Payer: Self-pay | Admitting: Family Medicine

## 2021-02-08 NOTE — Telephone Encounter (Signed)
Medication Refill - Medication: omeprazole (PRILOSEC) 20 MG capsule budesonide (PULMICORT) 0.5 MG/2ML nebulizer solution  Has the patient contacted their pharmacy? Yes.   (Agent: If no, request that the patient contact the pharmacy for the refill. If patient does not wish to contact the pharmacy document the reason why and proceed with request.) (Agent: If yes, when and what did the pharmacy advise?)  Preferred Pharmacy (with phone number or street name):  [id number] - [place], [place] - [address] AT [address]  [address] [place] [address]  Phone: [phone] Fax: [phone]   Has the patient been seen for an appointment in the last year OR does the patient have an upcoming appointment? No.  Agent: Please be advised that RX refills may take up to 3 business days. We ask that you follow-up with your pharmacy.

## 2021-02-09 MED ORDER — BUDESONIDE 0.5 MG/2ML IN SUSP: 0.5000 mg | Freq: Two times a day (BID) | RESPIRATORY_TRACT | 0 refills | Status: DC

## 2021-02-09 MED ORDER — OMEPRAZOLE 20 MG PO CPDR: 20.0000 mg | DELAYED_RELEASE_CAPSULE | Freq: Every day | ORAL | 1 refills | Status: DC

## 2021-02-09 NOTE — Telephone Encounter (Signed)
Requested medications are due for refill today.  yes  Requested medications are on the active medications list.  yes  Last refill. 04/26/2020  Future visit scheduled.   no  Notes to clinic.  Historical provider
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
## 2021-03-22 ENCOUNTER — Other Ambulatory Visit: Payer: Self-pay | Admitting: Family Medicine

## 2021-03-22 NOTE — Telephone Encounter (Signed)
Medication Refill - Medication:  albuterol (ACCUNEB) 0.63 MG/3ML nebulizer solution budesonide (PULMICORT) 0.5 MG/2ML nebulizer solution  omeprazole (PRILOSEC) 20 MG capsule  PROAIR HFA 108 (90 Base) MCG/ACT inhaler    Has the patient contacted their pharmacy? Yes.   Contact PCP  Preferred Pharmacy (with phone number or street name):  [id number] - [place], [place] - [place] AT [address]  [address] [place], [place] [address]  Phone:  [phone]  Fax:  [phone]   Has the patient been seen for an appointment in the last year OR does the patient have an upcoming appointment? Yes.    Agent: Please be advised that RX refills may take up to 3 business days. We ask that you follow-up with your pharmacy.
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
## 2021-03-23 MED ORDER — BUDESONIDE 0.5 MG/2ML IN SUSP: 0.5000 mg | Freq: Two times a day (BID) | RESPIRATORY_TRACT | 0 refills | Status: DC

## 2021-03-23 MED ORDER — PROAIR HFA 108 (90 BASE) MCG/ACT IN AERS: INHALATION_SPRAY | RESPIRATORY_TRACT | 0 refills | Status: DC

## 2021-03-23 MED ORDER — ALBUTEROL SULFATE 0.63 MG/3ML IN NEBU: 1.0000 | INHALATION_SOLUTION | Freq: Four times a day (QID) | RESPIRATORY_TRACT | 0 refills | Status: DC | PRN

## 2021-04-03 NOTE — Addendum Note (Signed)
Addended by: Elliot Cousin on: 04/03/2021 06:59 PM   Modules accepted: Orders

## 2021-04-03 NOTE — Telephone Encounter (Signed)
Ms. Nelida Gores calling in again and is requesting to also have the pts Cetirizine refilled. Please advise.

## 2021-04-03 NOTE — Telephone Encounter (Signed)
Ms. Kerry Graves called in and stated she needed refill for allopurinol (ZYLOPRIM) 100 MG tablet  She was at the pharmacy when she called and they stated they need an Rx for this medication /  Ms. Kerry Graves also stated that she can not give the pt Budesonide and need the brand PULMICORT /the pharmacy needs an Rx for this as well / Please advise asap / pt is completely out and was advised med would continued to be refilled   Pharmacy has received the 12.8.22 Rx for albuterol, Proair and they have a refill for omeprazole from 10.27.22/ [id number] [place], [place]  [address] [place], [place] [address]  Phone:  [phone]  Fax:  [phone]
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
## 2021-04-04 ENCOUNTER — Other Ambulatory Visit: Payer: Self-pay | Admitting: Physician Assistant

## 2021-04-04 MED ORDER — ALLOPURINOL 100 MG PO TABS: 100.0000 mg | ORAL_TABLET | Freq: Every day | ORAL | 0 refills | Status: DC

## 2021-04-04 MED ORDER — OMEPRAZOLE 20 MG PO CPDR: 20.0000 mg | DELAYED_RELEASE_CAPSULE | Freq: Every day | ORAL | 0 refills | Status: DC

## 2021-04-04 NOTE — Addendum Note (Signed)
Addended by: Shawna Orleans on: 04/04/2021 01:37 PM   Modules accepted: Orders
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
## 2021-04-21 ENCOUNTER — Other Ambulatory Visit: Payer: Self-pay | Admitting: Physician Assistant

## 2021-04-21 ENCOUNTER — Encounter: Payer: Self-pay | Admitting: Family Medicine

## 2021-04-21 MED ORDER — CETIRIZINE HCL 10 MG PO CAPS: 10.0000 mg | ORAL_CAPSULE | Freq: Every day | ORAL | 0 refills | Status: DC

## 2021-04-21 NOTE — Telephone Encounter (Signed)
Medication Refill - Medication: Cetirizine HCl 10 MG CAPS  Has the patient contacted their pharmacy? Yes.   Pt is out of this medication.  Has been trying to get, and getting the run around. I do not see anything in chart about this.  But needs this med asap.  Preferred Pharmacy (with phone number or street name): [id number] - [place], [place] Has the patient been seen for an appointment in the last year OR does the patient have an upcoming appointment? Yes.    Agent: Please be advised that RX refills may take up to 3 business days. We ask that you follow-up with your pharmacy.
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
## 2021-04-24 NOTE — Progress Notes (Signed)
Established patient visit   Patient: Kerry Graves   DOB: May 24, 1995   26 y.o. Male  MRN: [id number] Visit Date: 04/26/2021  Today's healthcare provider: Mikey Kirschner, PA-C   Chief Complaint  Patient presents with   URI   Subjective      Bertrum is a 26 y/o male who presents today with his caregiver Neoma Laming with concerns over symptoms of cough, sinus congestion, rhinorrhea for the last 7 days. They state that he felt better but today his symptoms came back. Denies fevers, chills, abdominal pain, rashes, increased wheezing, shortness of breath. Reports sister at home is sick as well.   Due to history of reactive airway disease and bronchiolitis obliterans, pt has been doing albuterol and budesonide nebulizers at least once daily, and during this period while he has been sick, he has been taking an albuterol nebulizer every 6 hours.   Reports other chronic conditions are managed, denies any gout flares on allopurinol.   Medications: Outpatient Medications Prior to Visit  Medication Sig   allopurinol (ZYLOPRIM) 100 MG tablet TAKE 1 TABLET(100 MG) BY MOUTH DAILY   cetirizine (ZYRTEC) 10 MG tablet TAKE 1 TABLET(10 MG) BY MOUTH DAILY   DERMA-SMOOTHE/FS BODY 0.01 % OIL Apply 1 application topically as directed. Apply once to twice daily to scalp/body, do not rinse off   ketoconazole (NIZORAL) 2 % shampoo Massage into scalp 1-2x/wk, let sit several minutes until improved.   PROTOPIC 0.1 % ointment Apply topically 2 (two) times daily. To affected area of groin until improved   [DISCONTINUED] albuterol (ACCUNEB) 0.63 MG/3ML nebulizer solution Take 3 mLs (0.63 mg total) by nebulization every 6 (six) hours as needed for wheezing. Please schedule office visit before any future refills.   [DISCONTINUED] budesonide (PULMICORT) 0.5 MG/2ML nebulizer solution Take 2 mLs (0.5 mg total) by nebulization 2 (two) times daily. Please schedule office visit before any future refills.    [DISCONTINUED] clindamycin (CLEOCIN T) 1 % external solution Apply to chest and back daily after shower for folliculitis.   [DISCONTINUED] clobetasol ointment (TEMOVATE) 0.05 % Apply to affected area knee and scalp twice daily until improved. Avoid face, groin, underarms.   [DISCONTINUED] fluticasone (FLONASE) 50 MCG/ACT nasal spray Place 1 spray into both nostrils daily.   [DISCONTINUED] omeprazole (PRILOSEC) 20 MG capsule Take 1 capsule (20 mg total) by mouth daily.   [DISCONTINUED] PROAIR HFA 108 (90 Base) MCG/ACT inhaler SMARTSIG:1 Puff(s) By Mouth Every 6 Hours PRN. Please schedule office visit before any future refills.   [DISCONTINUED] Apremilast (OTEZLA) 10 & 20 & 30 MG TBPK Take as directed per package instructions. (Patient not taking: Reported on 04/26/2021)   [DISCONTINUED] Apremilast (OTEZLA) 30 MG TABS Take 1 tablet (30 mg total) by mouth 2 (two) times daily. (Patient not taking: Reported on 04/26/2021)   [DISCONTINUED] Apremilast (OTEZLA) 30 MG TABS Take 1 tablet (30 mg total) by mouth 2 (two) times daily. (Patient not taking: Reported on 04/26/2021)   No facility-administered medications prior to visit.    Review of Systems  Constitutional:  Positive for fatigue. Negative for activity change, appetite change, chills, diaphoresis, fever and unexpected weight change.  HENT:  Positive for congestion and rhinorrhea. Negative for ear discharge, ear pain, postnasal drip, sinus pressure, sinus pain, sneezing, sore throat, trouble swallowing and voice change.   Eyes:  Positive for discharge. Negative for photophobia, pain, redness, itching and visual disturbance.  Respiratory:  Positive for cough and shortness of breath. Negative for apnea, choking,  chest tightness, wheezing and stridor.   Gastrointestinal: Negative.  Negative for diarrhea and nausea.  Neurological:  Negative for dizziness, light-headedness and headaches.      Objective    BP 101/76 (BP Location: Left Arm, Patient  Position: Sitting, Cuff Size: Large)    Pulse 77    Temp 98.5 F (36.9 C) (Oral)    Wt 205 lb (93 kg)    SpO2 98%    Physical Exam Constitutional:      General: He is awake.     Appearance: He is well-developed.  HENT:     Head: Normocephalic.     Right Ear: Tympanic membrane normal.     Left Ear: Tympanic membrane normal.     Ears:     Comments: Psoriasis bilateral external canals    Nose: Congestion and rhinorrhea present.     Mouth/Throat:     Mouth: Mucous membranes are dry.     Pharynx: Posterior oropharyngeal erythema present.  Eyes:     Conjunctiva/sclera: Conjunctivae normal.  Cardiovascular:     Rate and Rhythm: Normal rate and regular rhythm.     Heart sounds: Normal heart sounds.  Pulmonary:     Effort: Pulmonary effort is normal.     Breath sounds: Normal breath sounds. No stridor. No wheezing or rales.  Skin:    General: Skin is warm.  Neurological:     Mental Status: He is alert and oriented to person, place, and time.  Psychiatric:        Attention and Perception: Attention normal.        Mood and Affect: Mood normal.        Speech: Speech normal.        Behavior: Behavior is cooperative.     No results found for any visits on 04/26/21.  Assessment & Plan     URI Advised mucinex cough and cold or other similar medication OTC, we reviewed different OTC medications together. Increase fluid intake, monitor for fevers and call office if fever --covid,flu,rsv swab  Other dx pulled for refills, 90 day rx requested for all medications.  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 Continue with albuterol neb as needed for wheezing and budesonide neb daily. --ref to pulm       Relevant Medications   budesonide (PULMICORT) 0.25 MG/2ML nebulizer solution   albuterol (ACCUNEB) 0.63 MG/3ML nebulizer solution    PROAIR HFA 108 (90 Base) MCG/ACT inhaler   Other Relevant Orders   Ambulatory referral to Pulmonology   Reactive airway disease   Relevant Medications   budesonide (PULMICORT) 0.25 MG/2ML nebulizer solution   albuterol (ACCUNEB) 0.63 MG/3ML nebulizer solution   PROAIR HFA 108 (90 Base) MCG/ACT inhaler   Other Relevant Orders   Ambulatory referral to Pulmonology   Other Visit Diagnoses     Upper respiratory tract infection, unspecified type    -  Primary   Relevant Medications   fluticasone (FLONASE) 50 MCG/ACT nasal spray   Other Relevant Orders   COVID-19, Flu A+B and RSV   Folliculitis       Relevant Medications   clindamycin (CLEOCIN T) 1 % external solution   Psoriasis       Relevant Medications   clobetasol ointment (TEMOVATE) 0.05 %   Gastroesophageal reflux disease, unspecified whether esophagitis present  Relevant Medications   omeprazole (PRILOSEC) 20 MG capsule       Return in about 6 months (around 10/24/2021) for chronic conditions, cpe.      I, Mikey Kirschner, PA-C have reviewed all documentation for this visit. The documentation on  04/26/2021 for the exam, diagnosis, procedures, and orders are all accurate and complete.    Mikey Kirschner, PA-C  [facility] [phone] (phone) [phone] (fax)  [place]

## 2021-04-26 ENCOUNTER — Ambulatory Visit (INDEPENDENT_AMBULATORY_CARE_PROVIDER_SITE_OTHER): Payer: Medicaid Other | Admitting: Physician Assistant

## 2021-04-26 ENCOUNTER — Other Ambulatory Visit: Payer: Self-pay

## 2021-04-26 ENCOUNTER — Other Ambulatory Visit: Payer: Self-pay | Admitting: Physician Assistant

## 2021-04-26 ENCOUNTER — Encounter: Payer: Self-pay | Admitting: Physician Assistant

## 2021-04-26 VITALS — BP 101/76 | HR 77 | Temp 98.5°F | Wt 205.0 lb

## 2021-04-26 DIAGNOSIS — J069 Acute upper respiratory infection, unspecified: Secondary | ICD-10-CM

## 2021-04-26 DIAGNOSIS — J42 Unspecified chronic bronchitis: Secondary | ICD-10-CM

## 2021-04-26 DIAGNOSIS — L409 Psoriasis, unspecified: Secondary | ICD-10-CM | POA: Diagnosis not present

## 2021-04-26 DIAGNOSIS — K219 Gastro-esophageal reflux disease without esophagitis: Secondary | ICD-10-CM

## 2021-04-26 DIAGNOSIS — L739 Follicular disorder, unspecified: Secondary | ICD-10-CM | POA: Diagnosis not present

## 2021-04-26 DIAGNOSIS — J4520 Mild intermittent asthma, uncomplicated: Secondary | ICD-10-CM

## 2021-04-26 MED ORDER — CLINDAMYCIN PHOSPHATE 1 % EX SOLN: CUTANEOUS | 3 refills | Status: AC

## 2021-04-26 MED ORDER — BUDESONIDE 0.25 MG/2ML IN SUSP: 0.2500 mg | Freq: Every day | RESPIRATORY_TRACT | 1 refills | Status: DC

## 2021-04-26 MED ORDER — FLUTICASONE PROPIONATE 50 MCG/ACT NA SUSP: 2.0000 | Freq: Every day | NASAL | 1 refills | Status: DC

## 2021-04-26 MED ORDER — ALBUTEROL SULFATE 0.63 MG/3ML IN NEBU: 1.0000 | INHALATION_SOLUTION | Freq: Four times a day (QID) | RESPIRATORY_TRACT | 0 refills | Status: DC | PRN

## 2021-04-26 MED ORDER — OMEPRAZOLE 20 MG PO CPDR: 20.0000 mg | DELAYED_RELEASE_CAPSULE | Freq: Every day | ORAL | 1 refills | Status: DC

## 2021-04-26 MED ORDER — CLOBETASOL PROPIONATE 0.05 % EX OINT: TOPICAL_OINTMENT | CUTANEOUS | 2 refills | Status: DC

## 2021-04-26 MED ORDER — PROAIR HFA 108 (90 BASE) MCG/ACT IN AERS: INHALATION_SPRAY | RESPIRATORY_TRACT | 0 refills | Status: DC

## 2021-04-26 NOTE — Assessment & Plan Note (Signed)
Historically managed by a pediatric pulmonologist but has not been since 2018 from what I can see. Continue with albuterol neb as needed for wheezing and budesonide neb daily. --ref to pulm

## 2021-04-27 ENCOUNTER — Ambulatory Visit: Payer: Self-pay | Admitting: *Deleted

## 2021-04-27 LAB — COVID-19, FLU A+B AND RSV
Influenza A, NAA: NOT DETECTED
Influenza B, NAA: NOT DETECTED
RSV, NAA: NOT DETECTED
SARS-CoV-2, NAA: NOT DETECTED

## 2021-04-27 NOTE — Telephone Encounter (Signed)
Pt given lab results with permission to give with D. Gerner present per notes of L. Drubel, PA on 04/27/21. Pt verbalized understanding.

## 2021-05-19 IMAGING — CR DG CERVICAL SPINE COMPLETE 4+V
1 series · 6 of 6 positions shown · non-contrast
Comparison: 11/25/2014

CLINICAL DATA: Neck pain

EXAM:
CERVICAL SPINE - COMPLETE 4+ VIEW

[Series 1: dg cervical spine complete · 0.14mm/px · 6 of 6 slices shown]
[im 1/6]
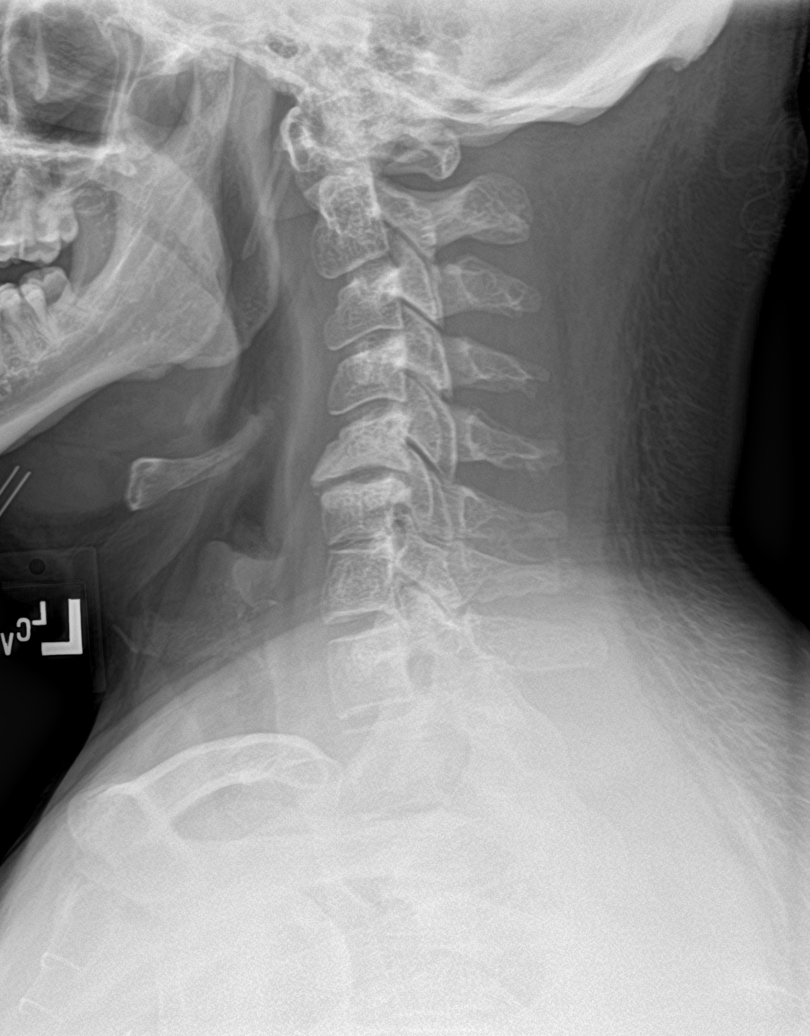
[im 2/6]
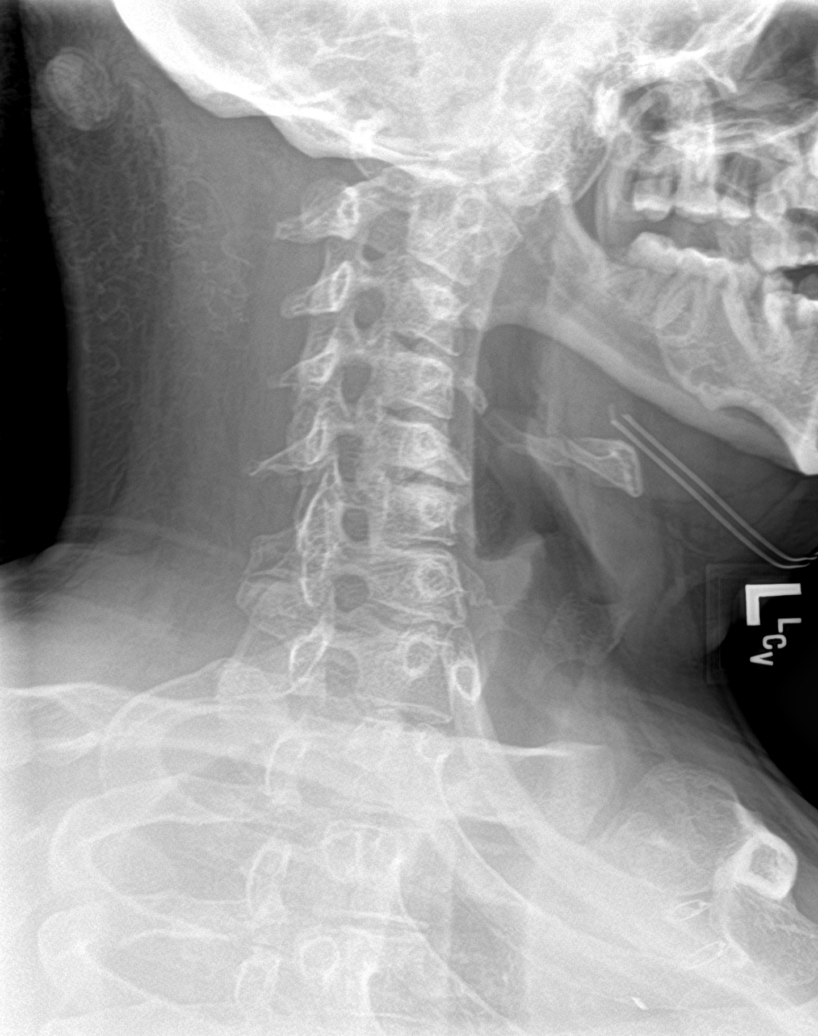
[im 3/6]
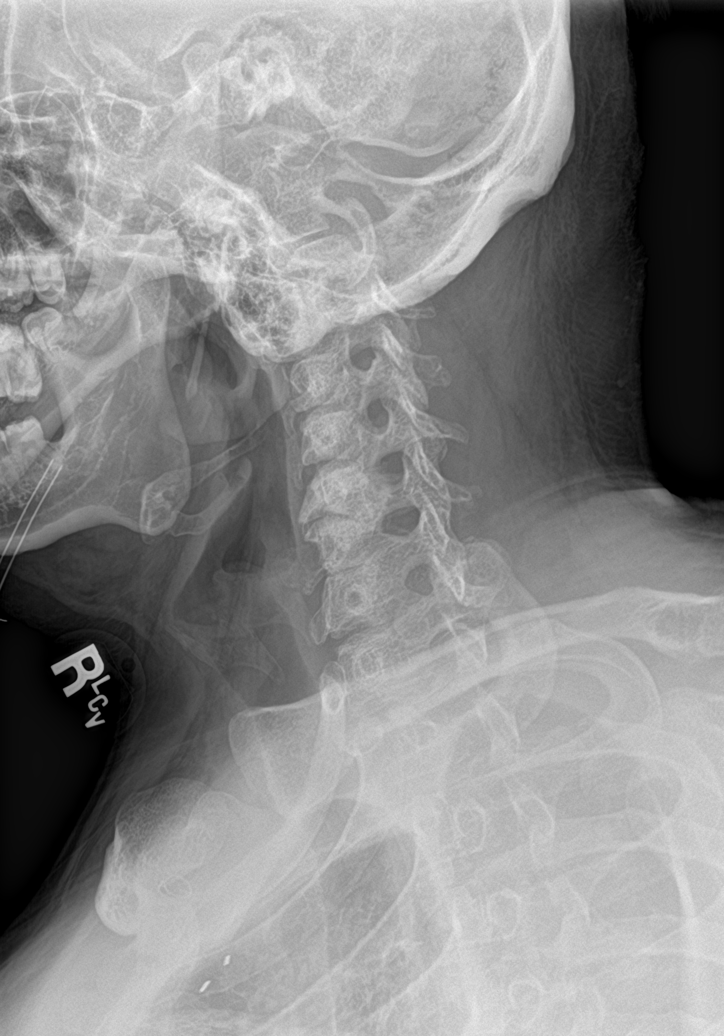
[im 4/6]
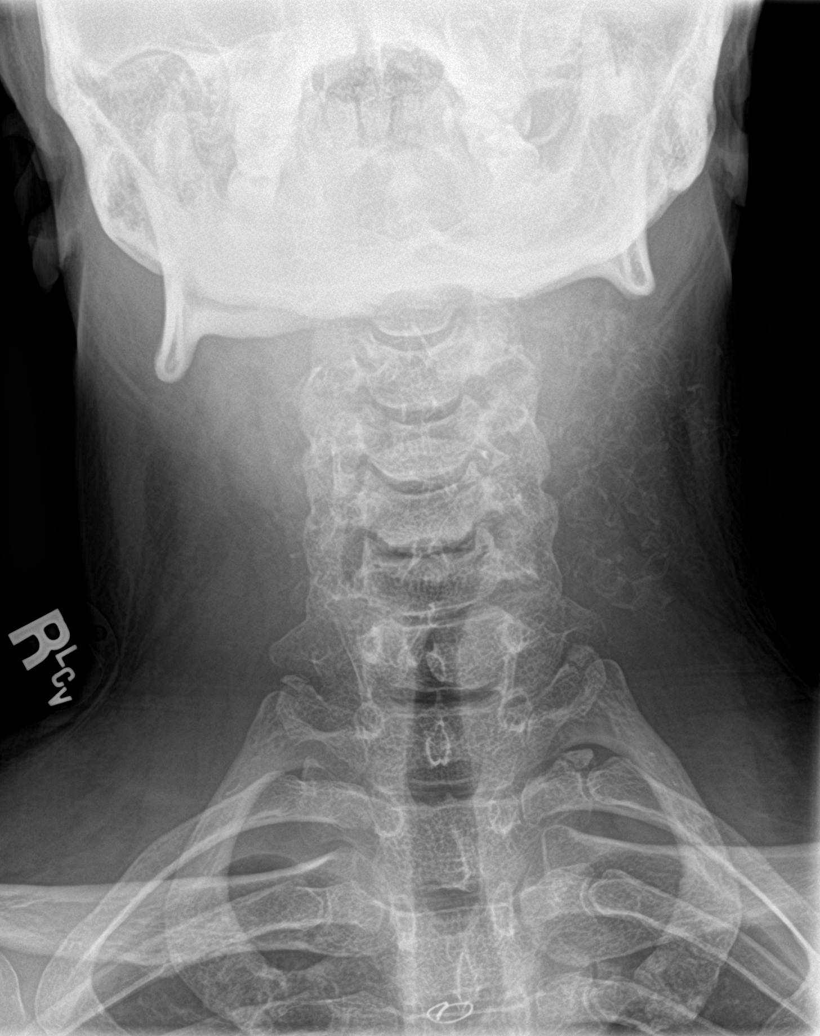
[im 5/6]
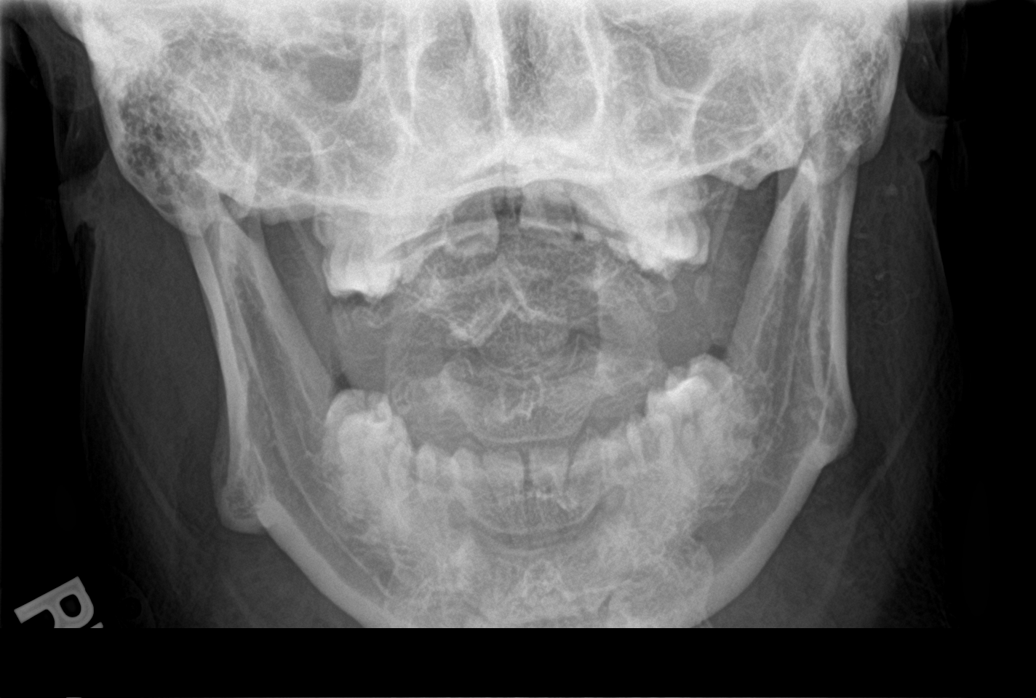
[im 6/6]
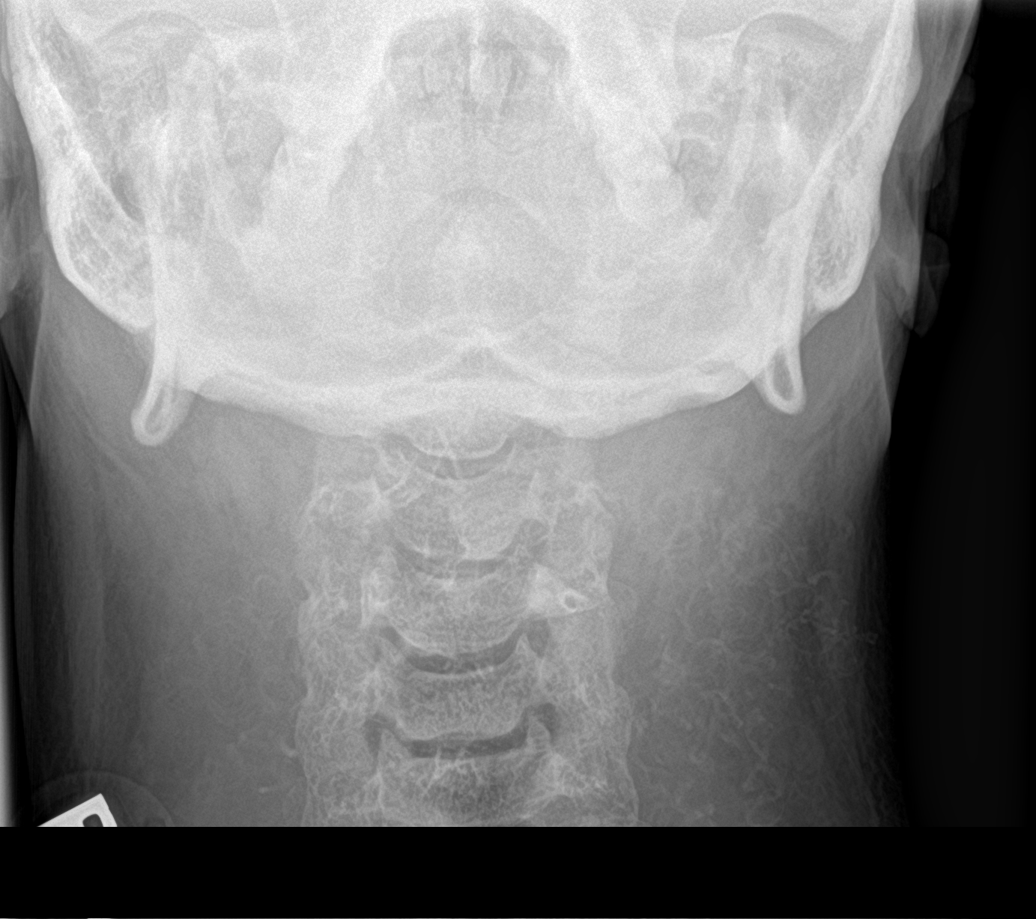

[6 of 6 positions shown; findings below may reference images not displayed]

FINDINGS: Degenerative disc disease at C5-6 and C6-7 with disc space narrowing
and spurring, progressed since prior study. Loss of cervical
lordosis. Slight right neural foraminal narrowing at C5-6 due to
uncovertebral spurring. Prevertebral soft tissues are normal. No
fracture.
IMPRESSION: Degenerative disc disease at C5-6 and C6-7. No acute bony
abnormality.

## 2021-05-22 ENCOUNTER — Institutional Professional Consult (permissible substitution): Payer: Medicaid Other | Admitting: Internal Medicine

## 2021-06-22 ENCOUNTER — Ambulatory Visit (INDEPENDENT_AMBULATORY_CARE_PROVIDER_SITE_OTHER): Payer: Medicaid Other | Admitting: Internal Medicine

## 2021-06-22 ENCOUNTER — Other Ambulatory Visit: Payer: Self-pay

## 2021-06-22 ENCOUNTER — Encounter: Payer: Self-pay | Admitting: Internal Medicine

## 2021-06-22 DIAGNOSIS — J4520 Mild intermittent asthma, uncomplicated: Secondary | ICD-10-CM

## 2021-06-22 DIAGNOSIS — J9611 Chronic respiratory failure with hypoxia: Secondary | ICD-10-CM | POA: Diagnosis not present

## 2021-06-22 NOTE — Progress Notes (Signed)
? ?Kerry Graves, male    DOB: 08-24-1995  MRN: [id number] ? ? ?Brief patient profile:  ?25   yobm with Down syndrome and  asthma all his life prev under care of [facility]/pulmonary  referred to pulmonary clinic in [facility]  06/22/2021 by Kerry Lombard PA with dx of BO  ? ?05/25/14 asthma clinic note: ?Kerry Graves is a 26 y.o. male with Down Syndrome, past history of Chronic Lung Disease, Bronchiolitis Obliterans, Airflow obstruction, intermittent snoring and likely OSA with nocturnal hypoxemia and pulmonary Hypertension/ mom declined cpap ? ?PFTs 02/02/2015 restrictive, no obstruction  ? ? ?History of Present Illness  ?06/22/2021  Pulmonary/ 1st office eval/ Kerry Graves / [employer]  Office  ?Chief Complaint  ?Patient presents with  ? pulmonary consult  ?  Hx of asthma.-0.5L QHS-no current sx.   ?Dyspnea:  has a trainer x one month does reps with wt and walking laps in between with sats hold ing mid 90s ?Cough: none  ?Sleep: flat bed one pillow  ?SABA use: not using this or prednisone  ?02  0.5 lpm hs and prn sob ? ?No obvious day to day or daytime variability or assoc excess/ purulent sputum or mucus plugs or hemoptysis or cp or chest tightness, subjective wheeze or overt sinus or hb symptoms.  ? ?Sleeping  without nocturnal  or early am exacerbation  of respiratory  c/o's or need for noct saba. Also denies any obvious fluctuation of symptoms with weather or environmental changes or other aggravating or alleviating factors except as outlined above  ? ?No unusual exposure hx or h/o childhood pna or knowledge of premature birth. ? ?Current Allergies, Complete Past Medical History, Past Surgical History, Family History, and Social History were reviewed in [employer] record. ? ?ROS  The following are not active complaints unless bolded ?Hoarseness, sore throat, dysphagia, dental problems, itching, sneezing,  nasal congestion or discharge of excess mucus or purulent secretions, ear ache,   fever, chills,  sweats, unintended wt loss or wt gain, classically pleuritic or exertional cp,  orthopnea pnd or arm/hand swelling  or leg swelling, presyncope, palpitations, abdominal pain, anorexia, nausea, vomiting, diarrhea  or change in bowel habits or change in bladder habits, change in stools or change in urine, dysuria, hematuria,  rash, arthralgias, visual complaints, headache, numbness, weakness or ataxia or problems with walking or coordination,  change in mood or  memory. ?      ?   ? ? ?Past Medical History:  ?Diagnosis Date  ? Allergy   ? Asthma   ? Blood transfusion without reported diagnosis   ? Oxygen deficiency   ? Sleep apnea   ? ? ?Outpatient Medications Prior to Visit  ?Medication Sig Dispense Refill  ? albuterol (ACCUNEB) 0.63 MG/3ML nebulizer solution Take 3 mLs (0.63 mg total) by nebulization every 6 (six) hours as needed for wheezing. 810 mL 0  ? allopurinol (ZYLOPRIM) 100 MG tablet TAKE 1 TABLET(100 MG) BY MOUTH DAILY 90 tablet 0  ? budesonide (PULMICORT) 0.25 MG/2ML nebulizer solution Take 2 mLs (0.25 mg total) by nebulization daily. 180 mL 1  ? cetirizine (ZYRTEC) 10 MG tablet TAKE 1 TABLET(10 MG) BY MOUTH DAILY 90 tablet 0  ? clindamycin (CLEOCIN T) 1 % external solution Apply to chest and back daily after shower for folliculitis. 60 mL 3  ? clobetasol ointment (TEMOVATE) 0.05 % Apply to affected area knee and scalp twice daily until improved. Avoid face, groin, underarms. 60 g 2  ? DERMA-SMOOTHE/FS BODY  0.01 % OIL Apply 1 application topically as directed. Apply once to twice daily to scalp/body, do not rinse off 118.28 mL 3  ? fluticasone (FLONASE) 50 MCG/ACT nasal spray SHAKE LIQUID AND USE 2 SPRAYS IN EACH NOSTRIL DAILY 48 g 1  ? ketoconazole (NIZORAL) 2 % shampoo Massage into scalp 1-2x/wk, let sit several minutes until improved. 120 mL 2  ? omeprazole (PRILOSEC) 20 MG capsule Take 1 capsule (20 mg total) by mouth daily. 90 capsule 1  ? PROAIR HFA 108 (90 Base) MCG/ACT inhaler SMARTSIG:1 Puff(s) By  Mouth Every 6 Hours PRN. Please schedule office visit before any future refills. 6.7 g 0  ? PROTOPIC 0.1 % ointment Apply topically 2 (two) times daily. To affected area of groin until improved 60 g 1  ? ?No facility-administered medications prior to visit.  ? ? ? ?Objective:  ?  ? ?BP 118/70 (BP Location: Left Arm, Cuff Size: Normal)   Pulse 83   Temp (!) 97.3 ?F (36.3 ?C) (Temporal)   Ht 5' (1.524 m)   Wt 197 lb 6.4 oz (89.5 kg)   SpO2 93%   BMI 38.55 kg/m?  ? ?SpO2: 93 % ? ?Obese pleasant amb bm with classic down syndrome features ? ? HEENT : pt wearing mask not removed for exam due to covid -19 concerns.  ? ? ?NECK :  without JVD/Nodes/TM/ nl carotid upstrokes bilaterally ? ? ?LUNGS: no acc muscle use,  Nl contour chest which is clear to A and P bilaterally without cough on insp or exp maneuvers ? ? ?CV:  RRR  no s3 2/6 SEM s   increase in P2, and no edema  ? ?ABD:  soft and nontender with nl inspiratory excursion in the supine position. No bruits or organomegaly appreciated, bowel sounds nl ? ?MS:  Nl gait/ ext warm without deformities, calf tenderness, cyanosis or clubbing ?No obvious joint restrictions  ? ?SKIN: warm and dry without lesions   ? ?NEURO:  alert, approp, nl sensorium with  no motor or cerebellar deficits apparent.  ? ? ?   ?Assessment  ? ?Mild intermittent asthma ?Onset in childhood  ? ?Apparently well controlled on "prn budesonide" so created an action plan for bid rx with alb also bid plus albuterol nebs between to supplement the bid rx with understanding the budesonide should be use for at least a week before starting taper down/ off.  ? ?Also reviewed how to use prn saba hfa but will need to return to clinic with this device for teaching.  ? ?No evidence of BO clinically but no recent CXR or pfts on record, will do prn  ? ?    ? ?Chronic respiratory failure with hypoxia (HCC) ?rx 0.5 lpm hs and prn per [facility] ped asthma  ? ?Advised: ?Make sure you check your oxygen saturation  AT  your  highest level of activity (not after you stop)   to be sure it stays over 90% and adjust  02 flow upward to maintain this level if needed ? ? ? ?Each maintenance medication was reviewed in detail including emphasizing most importantly the difference between maintenance and prns and under what circumstances the prns are to be triggered using an action plan format where appropriate. ? ?Total time for H and P, chart review, counseling, reviewing neb/hfa/02 device(s) and generating customized AVS unique to this office visit / same day charting = 41 min  ? ? ?Christinia Gully, MD ?06/23/2021 ?    ?

## 2021-06-22 NOTE — Patient Instructions (Addendum)
02  @ 0.5 lpm at bedtime and as needed during the day for saturations less than 90%   ? ? ?At the first sign of any flare recommend albuterol / budesonide a day until 100% for a whole week then ok to taper off  ? ?Only use your albuterol as a rescue medication to be used if you can't catch your breath by resting or doing a relaxed purse lip breathing pattern.  ?- The less you use it, the better it will work when you need it. ?- Ok to use up to every 4 hours if you must but call for immediate appointment if use goes up over your usual need ?- Don't leave home without  your proair which can be used up to every 4 hours x 2 puffs when not a home where I prefer the albuterol  ? ? ?Please schedule a follow up office visit in 6 weeks, call sooner if needed  ?Add discuss cards f/u next ov plus ? Needs new echo?  ?

## 2021-06-23 ENCOUNTER — Encounter: Payer: Self-pay | Admitting: Internal Medicine

## 2021-06-23 DIAGNOSIS — J4520 Mild intermittent asthma, uncomplicated: Secondary | ICD-10-CM | POA: Insufficient documentation

## 2021-06-23 DIAGNOSIS — J9611 Chronic respiratory failure with hypoxia: Secondary | ICD-10-CM | POA: Insufficient documentation

## 2021-06-23 NOTE — Assessment & Plan Note (Addendum)
rx 0.5 lpm hs and prn per [facility] ped asthma  ? ?Advised: ?Make sure you check your oxygen saturation  AT  your highest level of activity (not after you stop)   to be sure it stays over 90% and adjust  02 flow upward to maintain this level if needed  ? ?Each maintenance medication was reviewed in detail including emphasizing most importantly the difference between maintenance and prns and under what circumstances the prns are to be triggered using an action plan format where appropriate. ? ?Total time for H and P, chart review, counseling, reviewing neb/hfa/02 device(s) and generating customized AVS unique to this office visit / same day charting = 41 min  ? ? ?

## 2021-06-23 NOTE — Assessment & Plan Note (Addendum)
Onset in childhood  ? ?Apparently well controlled on "prn budesonide" so created an action plan for bid rx with alb also bid plus albuterol nebs between to supplement the bid rx with understanding the budesonide should be use for at least a week before starting taper down/ off.  ? ?Also reviewed how to use prn saba hfa but will need to return to clinic with this device for teaching.  ? ?No evidence of BO clinically but no recent CXR or pfts on record, will do prn  ? ?    ?  ? ? ?

## 2021-08-09 ENCOUNTER — Other Ambulatory Visit: Payer: Self-pay | Admitting: Dermatology

## 2021-08-09 ENCOUNTER — Other Ambulatory Visit: Payer: Self-pay | Admitting: Physician Assistant

## 2021-08-09 DIAGNOSIS — L409 Psoriasis, unspecified: Secondary | ICD-10-CM

## 2021-08-17 ENCOUNTER — Ambulatory Visit: Payer: Medicaid Other | Admitting: Internal Medicine

## 2021-10-03 ENCOUNTER — Other Ambulatory Visit: Payer: Self-pay | Admitting: Dermatology

## 2021-10-03 DIAGNOSIS — L409 Psoriasis, unspecified: Secondary | ICD-10-CM

## 2021-10-27 ENCOUNTER — Ambulatory Visit: Payer: Medicaid Other | Admitting: Internal Medicine

## 2021-10-27 NOTE — Progress Notes (Deleted)
Kerry Graves, male    DOB: 05-16-1995  MRN: [id number]   Brief patient profile:  32   yobm with Down syndrome and  asthma all his life prev under care of [facility]/pulmonary  referred to pulmonary clinic in [facility]  06/22/2021 by Kerry Lombard PA with dx of BO   05/25/14 asthma clinic note: Kerry Graves is a 26 y.o. male with Down Syndrome, past history of Chronic Lung Disease, Bronchiolitis Obliterans, Airflow obstruction, intermittent snoring and likely OSA with nocturnal hypoxemia and pulmonary Hypertension/ mom declined cpap  PFTs 02/02/2015 restrictive, no obstruction    History of Present Illness  06/22/2021  Pulmonary/ 1st office eval/ Kerry Graves / [facility]  Chief Complaint  Patient presents with   pulmonary consult    Hx of asthma.-0.5L QHS-no current sx.   Dyspnea:  has a trainer x one month does reps with wt and walking laps in between with sats hold ing mid 90s Cough: none  Sleep: flat bed one pillow  SABA use: not using this or prednisone  02  0.5 lpm hs and prn sob Rec 02  @ 0.5 lpm at bedtime and as needed during the day for saturations less than 90%   At the first sign of any flare recommend albuterol / budesonide a day until 100% for a whole week then ok to taper off  Only use your albuterol as a rescue medication   Add discuss cards f/u next ov plus ? Needs new echo?   10/27/2021  f/u ov/Kerry Graves/ [facility] re: ***   maint on ***  No chief complaint on file.   Dyspnea:  *** Cough: *** Sleeping: *** SABA use: *** 02: *** Covid status:   ***   No obvious day to day or daytime variability or assoc excess/ purulent sputum or mucus plugs or hemoptysis or cp or chest tightness, subjective wheeze or overt sinus or hb symptoms.   *** without nocturnal  or early am exacerbation  of respiratory  c/o's or need for noct saba. Also denies any obvious fluctuation of symptoms with weather or environmental changes or other aggravating or alleviating factors except  as outlined above   No unusual exposure hx or h/o childhood pna/ asthma or knowledge of premature birth.  Current Allergies, Complete Past Medical History, Past Surgical History, Family History, and Social History were reviewed in [employer] record.  ROS  The following are not active complaints unless bolded Hoarseness, sore throat, dysphagia, dental problems, itching, sneezing,  nasal congestion or discharge of excess mucus or purulent secretions, ear ache,   fever, chills, sweats, unintended wt loss or wt gain, classically pleuritic or exertional cp,  orthopnea pnd or arm/hand swelling  or leg swelling, presyncope, palpitations, abdominal pain, anorexia, nausea, vomiting, diarrhea  or change in bowel habits or change in bladder habits, change in stools or change in urine, dysuria, hematuria,  rash, arthralgias, visual complaints, headache, numbness, weakness or ataxia or problems with walking or coordination,  change in mood or  memory.        No outpatient medications have been marked as taking for the 10/27/21 encounter (Appointment) with Kerry Rockers, MD.           Objective:      Wt Readings from Last 3 Encounters:  06/22/21 197 lb 6.4 oz (89.5 kg)  04/26/21 205 lb (93 kg)  06/06/20 200 lb (90.7 kg)      Vital signs reviewed  10/27/2021  - Note  at rest 02 sats  ***% on ***   General appearance:    ***      2/6 SEM s   increase in P2       Assessment

## 2021-10-30 ENCOUNTER — Other Ambulatory Visit: Payer: Self-pay | Admitting: Dermatology

## 2021-11-07 ENCOUNTER — Other Ambulatory Visit: Payer: Self-pay | Admitting: Physician Assistant

## 2021-11-07 DIAGNOSIS — J42 Unspecified chronic bronchitis: Secondary | ICD-10-CM

## 2021-11-07 DIAGNOSIS — J4520 Mild intermittent asthma, uncomplicated: Secondary | ICD-10-CM

## 2021-11-21 ENCOUNTER — Other Ambulatory Visit: Payer: Self-pay | Admitting: Dermatology

## 2021-11-21 ENCOUNTER — Other Ambulatory Visit: Payer: Self-pay | Admitting: Physician Assistant

## 2021-11-21 DIAGNOSIS — J42 Unspecified chronic bronchitis: Secondary | ICD-10-CM

## 2021-11-21 DIAGNOSIS — J4520 Mild intermittent asthma, uncomplicated: Secondary | ICD-10-CM

## 2021-11-21 NOTE — Telephone Encounter (Signed)
[facility] faxed refill request for the following medications:  albuterol (ACCUNEB) 0.63 MG/3ML nebulizer solution   Please advise.

## 2021-11-22 ENCOUNTER — Telehealth: Payer: Self-pay | Admitting: Physician Assistant

## 2021-11-22 MED ORDER — ALBUTEROL SULFATE 0.63 MG/3ML IN NEBU: 1.0000 | INHALATION_SOLUTION | Freq: Four times a day (QID) | RESPIRATORY_TRACT | 0 refills | Status: DC | PRN
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
## 2021-11-22 NOTE — Telephone Encounter (Signed)
[facility] faxed refill request for the following medications:  DERMA-SMOOTHE/FS BODY 0.01 % OIL   Please advise.

## 2021-11-28 ENCOUNTER — Other Ambulatory Visit: Payer: Self-pay | Admitting: Dermatology

## 2021-11-29 ENCOUNTER — Telehealth: Payer: Self-pay | Admitting: Physician Assistant

## 2021-11-29 ENCOUNTER — Other Ambulatory Visit: Payer: Self-pay | Admitting: Physician Assistant

## 2021-11-29 DIAGNOSIS — J42 Unspecified chronic bronchitis: Secondary | ICD-10-CM

## 2021-11-29 DIAGNOSIS — J4520 Mild intermittent asthma, uncomplicated: Secondary | ICD-10-CM

## 2021-11-29 MED ORDER — PROAIR HFA 108 (90 BASE) MCG/ACT IN AERS: INHALATION_SPRAY | RESPIRATORY_TRACT | 1 refills | Status: AC

## 2021-11-29 NOTE — Telephone Encounter (Signed)
[facility] faxed refill request for the following medications:  PROAIR HFA 108 (90 Base) MCG/ACT inhaler   Please advise.

## 2021-11-29 NOTE — Assessment & Plan Note (Signed)
LAST VISIT WITH PULM 3/23 NO EVIDENCE OF BO TREATING AS ASTHMA

## 2021-11-30 ENCOUNTER — Other Ambulatory Visit: Payer: Self-pay | Admitting: Physician Assistant

## 2021-11-30 DIAGNOSIS — Q909 Down syndrome, unspecified: Secondary | ICD-10-CM

## 2021-11-30 DIAGNOSIS — M109 Gout, unspecified: Secondary | ICD-10-CM

## 2021-11-30 DIAGNOSIS — J4520 Mild intermittent asthma, uncomplicated: Secondary | ICD-10-CM

## 2021-11-30 DIAGNOSIS — E669 Obesity, unspecified: Secondary | ICD-10-CM

## 2021-12-01 ENCOUNTER — Other Ambulatory Visit: Payer: Self-pay | Admitting: Physician Assistant

## 2021-12-01 DIAGNOSIS — J4481 Bronchiolitis obliterans and bronchiolitis obliterans syndrome: Secondary | ICD-10-CM

## 2021-12-01 DIAGNOSIS — J4520 Mild intermittent asthma, uncomplicated: Secondary | ICD-10-CM

## 2021-12-01 DIAGNOSIS — J42 Unspecified chronic bronchitis: Secondary | ICD-10-CM
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
## 2021-12-07 ENCOUNTER — Ambulatory Visit (INDEPENDENT_AMBULATORY_CARE_PROVIDER_SITE_OTHER): Payer: Medicaid Other | Admitting: Physician Assistant

## 2021-12-07 ENCOUNTER — Encounter: Payer: Self-pay | Admitting: Physician Assistant

## 2021-12-07 VITALS — BP 100/87 | HR 84 | Wt 199.7 lb

## 2021-12-07 DIAGNOSIS — Z0000 Encounter for general adult medical examination without abnormal findings: Secondary | ICD-10-CM | POA: Diagnosis not present

## 2021-12-07 DIAGNOSIS — J4520 Mild intermittent asthma, uncomplicated: Secondary | ICD-10-CM

## 2021-12-07 DIAGNOSIS — M1A9XX0 Chronic gout, unspecified, without tophus (tophi): Secondary | ICD-10-CM | POA: Diagnosis not present

## 2021-12-07 DIAGNOSIS — J9611 Chronic respiratory failure with hypoxia: Secondary | ICD-10-CM | POA: Diagnosis not present

## 2021-12-07 DIAGNOSIS — M109 Gout, unspecified: Secondary | ICD-10-CM | POA: Insufficient documentation

## 2021-12-07 DIAGNOSIS — R7303 Prediabetes: Secondary | ICD-10-CM

## 2021-12-07 DIAGNOSIS — L409 Psoriasis, unspecified: Secondary | ICD-10-CM | POA: Insufficient documentation

## 2021-12-07 LAB — COMPREHENSIVE METABOLIC PANEL
ALT: 19 IU/L (ref 0–44)
AST: 24 IU/L (ref 0–40)
Albumin/Globulin Ratio: 1.2 (ref 1.2–2.2)
Albumin: 4.1 g/dL — ABNORMAL LOW (ref 4.3–5.2)
Alkaline Phosphatase: 64 IU/L (ref 44–121)
BUN/Creatinine Ratio: 9 (ref 9–20)
BUN: 11 mg/dL (ref 6–20)
Bilirubin Total: 0.7 mg/dL (ref 0.0–1.2)
CO2: 24 mmol/L (ref 20–29)
Calcium: 8.9 mg/dL (ref 8.7–10.2)
Chloride: 100 mmol/L (ref 96–106)
Creatinine, Ser: 1.16 mg/dL (ref 0.76–1.27)
Globulin, Total: 3.3 g/dL (ref 1.5–4.5)
Glucose: 91 mg/dL (ref 70–99)
Potassium: 4.5 mmol/L (ref 3.5–5.2)
Sodium: 138 mmol/L (ref 134–144)
Total Protein: 7.4 g/dL (ref 6.0–8.5)
eGFR: 90 mL/min/{1.73_m2} (ref >59)

## 2021-12-07 LAB — CBC WITH DIFFERENTIAL/PLATELET
Basophils Absolute: 0 10*3/uL (ref 0.0–0.2)
Basos: 1 %
EOS (ABSOLUTE): 0 10*3/uL (ref 0.0–0.4)
Eos: 0 %
Hematocrit: 49.1 % (ref 37.5–51.0)
Hemoglobin: 17.2 g/dL (ref 13.0–17.7)
Immature Grans (Abs): 0 10*3/uL (ref 0.0–0.1)
Immature Granulocytes: 0 %
Lymphocytes Absolute: 2.3 10*3/uL (ref 0.7–3.1)
Lymphs: 46 %
MCH: 32.3 pg (ref 26.6–33.0)
MCHC: 35 g/dL (ref 31.5–35.7)
MCV: 92 fL (ref 79–97)
Monocytes Absolute: 0.8 10*3/uL (ref 0.1–0.9)
Monocytes: 17 %
Neutrophils Absolute: 1.8 10*3/uL (ref 1.4–7.0)
Neutrophils: 36 %
Platelets: 195 10*3/uL (ref 150–450)
RBC: 5.32 x10E6/uL (ref 4.14–5.80)
RDW: 13.7 % (ref 11.6–15.4)
WBC: 4.9 10*3/uL (ref 3.4–10.8)

## 2021-12-07 LAB — HEMOGLOBIN A1C
Est. average glucose Bld gHb Est-mCnc: 123 mg/dL
Hgb A1c MFr Bld: 5.9 % — ABNORMAL HIGH (ref 4.8–5.6)

## 2021-12-07 LAB — LIPID PANEL
Chol/HDL Ratio: 3.5 ratio (ref 0.0–5.0)
Cholesterol, Total: 145 mg/dL (ref 100–199)
HDL: 41 mg/dL (ref >39)
LDL Chol Calc (NIH): 90 mg/dL (ref 0–99)
Triglycerides: 72 mg/dL (ref 0–149)
VLDL Cholesterol Cal: 14 mg/dL (ref 5–40)

## 2021-12-07 LAB — URIC ACID: Uric Acid: 9.4 mg/dL — ABNORMAL HIGH (ref 3.8–8.4)

## 2021-12-07 MED ORDER — ALLOPURINOL 200 MG PO TABS: 1.0000 | ORAL_TABLET | Freq: Every day | ORAL | 1 refills | Status: DC

## 2021-12-07 MED ORDER — DERMA-SMOOTHE/FS BODY 0.01 % EX OIL: 1.0000 "application " | TOPICAL_OIL | CUTANEOUS | 3 refills | Status: DC

## 2021-12-07 MED ORDER — TACROLIMUS 0.1 % EX OINT: TOPICAL_OINTMENT | CUTANEOUS | 0 refills | Status: DC

## 2021-12-07 NOTE — Assessment & Plan Note (Addendum)
Last saw pulm 3/23 and changes were made with regimen, pt was supposed to f/b 6 weeks but today caregiver Neoma Laming stated they were only supposed to return if he worsened. o2 on room air is 96% Encouraged this visit 2/2 o2 dependence at night and to ensure correct compliance w/ asthma regimen

## 2021-12-07 NOTE — Progress Notes (Signed)
I,Sha'taria Tyson,acting as a Education administrator for [employer], PA-C.,have documented all relevant documentation on the behalf of Mikey Kirschner, PA-C,as directed by  Mikey Kirschner, PA-C while in the presence of Mikey Kirschner, PA-C.   Complete physical exam   Patient: Kerry Graves   DOB: 1995-04-26   26 y.o. Male  MRN: [id number] Visit Date: 12/07/2021  Today's healthcare provider: Mikey Kirschner, PA-C   Cc. cpe  Subjective    Kerry Graves is a 26 y.o. male who presents today for a complete physical exam.  He reports consuming a general diet.  The patient reports to be working out at the gym for 45 minutes four days a week.   He generally feels well. He reports sleeping well.  HPI  Past Medical History:  Diagnosis Date   Allergy    Asthma    Blood transfusion without reported diagnosis    Oxygen deficiency    Sleep apnea    Past Surgical History:  Procedure Laterality Date   ASD REPAIR     LUNG BIOPSY     Social History   Socioeconomic History   Marital status: Single    Spouse name: Not on file   Number of children: Not on file   Years of education: Not on file   Highest education level: Not on file  Occupational History   Not on file  Tobacco Use   Smoking status: Never   Smokeless tobacco: Never  Substance and Sexual Activity   Alcohol use: Not Currently   Drug use: Yes   Sexual activity: Never  Other Topics Concern   Not on file  Social History Narrative   ** Merged History Encounter **       Social Determinants of Health   Financial Resource Strain: Not on file  Food Insecurity: Not on file  Transportation Needs: Not on file  Physical Activity: Not on file  Stress: Not on file  Social Connections: Not on file  Intimate Partner Violence: Not on file   Family Status  Relation Name Status   Mother  (Not Specified)   Family History  Problem Relation Age of Onset   Hypertension Mother    No Known Allergies  Patient Care  Team: Mikey Kirschner, PA-C as PCP - General (Physician Assistant)   Medications: Outpatient Medications Prior to Visit  Medication Sig   albuterol (ACCUNEB) 0.63 MG/3ML nebulizer solution Take 3 mLs (0.63 mg total) by nebulization every 6 (six) hours as needed for wheezing.   budesonide (PULMICORT) 0.25 MG/2ML nebulizer solution USE 2 ML(0.25 MG) VIA NEBULIZER DAILY   cetirizine (ZYRTEC) 10 MG tablet TAKE 1 TABLET(10 MG) BY MOUTH DAILY   clindamycin (CLEOCIN T) 1 % external solution Apply to chest and back daily after shower for folliculitis.   clobetasol ointment (TEMOVATE) 0.05 % Apply to affected area knee and scalp twice daily until improved. Avoid face, groin, underarms.   fluticasone (FLONASE) 50 MCG/ACT nasal spray SHAKE LIQUID AND USE 2 SPRAYS IN EACH NOSTRIL DAILY   ketoconazole (NIZORAL) 2 % shampoo APPLY EXTERNALLY TO THE AFFECTED AREA 1-2 TIMES PER WEEK, LET SIT SEVERAL MINUTES UNTIL IMPROVED   omeprazole (PRILOSEC) 20 MG capsule Take 1 capsule (20 mg total) by mouth daily.   PROAIR HFA 108 (90 Base) MCG/ACT inhaler SMARTSIG:1 Puff(s) By Mouth Every 6 Hours PRN.   [DISCONTINUED] allopurinol (ZYLOPRIM) 100 MG tablet TAKE 1 TABLET(100 MG) BY MOUTH DAILY   [DISCONTINUED] DERMA-SMOOTHE/FS BODY 0.01 % OIL Apply 1 application topically  as directed. Apply once to twice daily to scalp/body, do not rinse off   [DISCONTINUED] PROTOPIC 0.1 % ointment Apply topically 2 (two) times daily. To affected area of groin until improved   No facility-administered medications prior to visit.    Review of Systems  Constitutional:  Negative for fatigue and fever.  Respiratory:  Negative for cough and shortness of breath.   Cardiovascular:  Negative for chest pain, palpitations and leg swelling.  Genitourinary:  Positive for frequency.  Neurological:  Negative for dizziness and headaches.      Objective     Blood pressure 100/87, pulse 84, weight 199 lb 11.2 oz (90.6 kg), SpO2 96 %.     Physical Exam Constitutional:      General: He is awake.     Appearance: He is well-developed.  HENT:     Head: Normocephalic.     Right Ear: Tympanic membrane, ear canal and external ear normal.     Left Ear: Tympanic membrane, ear canal and external ear normal.     Nose: Nose normal. No congestion or rhinorrhea.     Mouth/Throat:     Mouth: Mucous membranes are moist.     Pharynx: No oropharyngeal exudate or posterior oropharyngeal erythema.  Eyes:     Pupils: Pupils are equal, round, and reactive to light.  Cardiovascular:     Rate and Rhythm: Normal rate and regular rhythm.     Heart sounds: Normal heart sounds.  Pulmonary:     Effort: Pulmonary effort is normal.     Breath sounds: Normal breath sounds.  Abdominal:     General: There is no distension.     Palpations: Abdomen is soft.     Tenderness: There is no abdominal tenderness. There is no guarding.  Musculoskeletal:     Cervical back: Normal range of motion.     Right lower leg: No edema.     Left lower leg: No edema.  Lymphadenopathy:     Cervical: No cervical adenopathy.  Skin:    General: Skin is warm.  Neurological:     Mental Status: He is alert and oriented to person, place, and time.  Psychiatric:        Attention and Perception: Attention normal.        Mood and Affect: Mood normal.        Speech: Speech normal.        Behavior: Behavior normal. Behavior is cooperative.     Last depression screening scores    12/07/2021    2:41 PM 03/01/2020   12:03 PM  PHQ 2/9 Scores  PHQ - 2 Score 0 0  PHQ- 9 Score 2    Last fall risk screening    12/07/2021    3:07 PM  Fall Risk   Falls in the past year? 1  Number falls in past yr: 0  Injury with Fall? 0  Risk for fall due to : History of fall(s)  Follow up Falls evaluation completed   Last Audit-C alcohol use screening    03/01/2020   12:02 PM  Alcohol Use Disorder Test (AUDIT)  1. How often do you have a drink containing alcohol? 0  2. How  many drinks containing alcohol do you have on a typical day when you are drinking? 0  3. How often do you have six or more drinks on one occasion? 0  AUDIT-C Score 0  Alcohol Brief Interventions/Follow-up AUDIT Score <7 follow-up not indicated   A score of  3 or more in women, and 4 or more in men indicates increased risk for alcohol abuse, EXCEPT if all of the points are from question 1   No results found for any visits on 12/07/21.  Assessment & Plan    Routine Health Maintenance and Physical Exam  Exercise Activities and Dietary recommendations --balanced diet high in fiber and protein, low in sugars, carbs, fats. --physical activity/exercise 30 minutes 3-5 times a week    Immunization History  Administered Date(s) Administered   Moderna Sars-Covid-2 Vaccination 04/16/2019, 05/15/2019    Health Maintenance  Topic Date Due   HPV VACCINES (1 - Male 2-dose series) Never done   TETANUS/TDAP  Never done   COVID-19 Vaccine (3 - Moderna series) 07/10/2019   INFLUENZA VACCINE  11/14/2021   Hepatitis C Screening  Completed   HIV Screening  Completed    Discussed health benefits of physical activity, and encouraged him to engage in regular exercise appropriate for his age and condition.  Problem List Items Addressed This Visit       Respiratory   Mild intermittent asthma    Last saw pulm 3/23 and changes were made with regimen, pt was supposed to f/b 6 weeks but today caregiver Neoma Laming stated they were only supposed to return if he worsened. o2 on room air is 96% Encouraged this visit 2/2 o2 dependence at night and to ensure correct compliance w/ asthma regimen      Chronic respiratory failure with hypoxia (HCC)    Pt is compliant with 0.5 lpm qhs        Musculoskeletal and Integument   Psoriasis    Not well controlled, will refill oil and tacrolimus but advised pt needs to be consistent w/ seeing derm at least annually.       Relevant Medications   DERMA-SMOOTHE/FS BODY  0.01 % OIL   tacrolimus (PROTOPIC) 0.1 % ointment   Other Relevant Orders   Ambulatory referral to Dermatology     Other   Gout    Managed with allopurinol 100 mg last uric acid 9.4 advised doubling to 200 mg and will recheck in 4 weeks      Relevant Medications   Allopurinol 200 MG TABS   Other Relevant Orders   Uric acid   Prediabetes    Found on recent labs, advised diet/exercise, cut down on amount of sodas pt is drinking      Other Visit Diagnoses     Annual physical exam    -  Primary       Return in about 6 months (around 06/09/2022) for chronic conditions.     I, Mikey Kirschner, PA-C have reviewed all documentation for this visit. The documentation on  12/07/2021 for the exam, diagnosis, procedures, and orders are all accurate and complete.  Mikey Kirschner, PA-C [facility] [address] [place], [place], [address] Office: [phone] Fax: [place]

## 2021-12-07 NOTE — Assessment & Plan Note (Addendum)
Found on recent labs, advised diet/exercise, cut down on amount of sodas pt is drinking

## 2021-12-07 NOTE — Assessment & Plan Note (Signed)
Managed with allopurinol 100 mg last uric acid 9.4 advised doubling to 200 mg and will recheck in 4 weeks

## 2021-12-07 NOTE — Assessment & Plan Note (Signed)
Not well controlled, will refill oil and tacrolimus but advised pt needs to be consistent w/ seeing derm at least annually.

## 2021-12-07 NOTE — Assessment & Plan Note (Addendum)
Pt is compliant with 0.5 lpm qhs

## 2021-12-07 NOTE — Patient Instructions (Addendum)
-  Please be sure to have Uric Acid labs repeated in one month and bring lab slip that was provided during your visit. Our lab is open Monday - Friday from 8:00 am - 11:30 am when they close for lunch and will reopen at 1:00 pm and close for the day at 4:30 pm.  -Please contact [facility] in [place], [place] located at [address] to see if they are accepting new patients. They can be contacted at [phone].  -Please contact [facility] at [phone] to schedule an appointment  -We Will begin flu clinic in office starting December 20, 2021 every Tuesday and Wednesday afternoon until further notice. If you would like to receive your vaccine this year please contact our office to schedule your nurse visit.  -Also keep in mind you are due for HPV Vaccine and Tetanus Vaccine, Information has been attached about both.

## 2022-02-20 ENCOUNTER — Other Ambulatory Visit: Payer: Self-pay | Admitting: Physician Assistant

## 2022-02-20 DIAGNOSIS — K219 Gastro-esophageal reflux disease without esophagitis: Secondary | ICD-10-CM
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
## 2022-02-21 ENCOUNTER — Ambulatory Visit (INDEPENDENT_AMBULATORY_CARE_PROVIDER_SITE_OTHER): Payer: Medicaid Other

## 2022-02-21 DIAGNOSIS — Z23 Encounter for immunization: Secondary | ICD-10-CM

## 2022-02-22 LAB — URIC ACID: Uric Acid: 5.6 mg/dL (ref 3.8–8.4)

## 2022-02-27 ENCOUNTER — Encounter: Payer: Self-pay | Admitting: Student in an Organized Health Care Education/Training Program

## 2022-02-27 ENCOUNTER — Ambulatory Visit (INDEPENDENT_AMBULATORY_CARE_PROVIDER_SITE_OTHER): Payer: Medicaid Other | Admitting: Student in an Organized Health Care Education/Training Program

## 2022-02-27 VITALS — BP 124/70 | HR 75 | Temp 98.0°F | Ht 60.0 in | Wt 206.6 lb

## 2022-02-27 DIAGNOSIS — G4733 Obstructive sleep apnea (adult) (pediatric): Secondary | ICD-10-CM | POA: Diagnosis not present

## 2022-02-27 DIAGNOSIS — J4520 Mild intermittent asthma, uncomplicated: Secondary | ICD-10-CM

## 2022-02-27 MED ORDER — BUDESONIDE-FORMOTEROL FUMARATE 80-4.5 MCG/ACT IN AERO: 2.0000 | INHALATION_SPRAY | RESPIRATORY_TRACT | 12 refills | Status: AC | PRN

## 2022-02-27 MED ORDER — ALBUTEROL SULFATE 0.63 MG/3ML IN NEBU: 1.0000 | INHALATION_SOLUTION | Freq: Four times a day (QID) | RESPIRATORY_TRACT | 0 refills | Status: DC | PRN

## 2022-02-27 MED ORDER — BUDESONIDE 0.25 MG/2ML IN SUSP: RESPIRATORY_TRACT | 1 refills | Status: AC

## 2022-02-27 NOTE — Progress Notes (Signed)
Synopsis: Follow up regarding reactive airway disease. Transition of care.  Assessment & Plan:   #Mild intermittent asthma without complication  The patient has been given a diagnosis of mild intermittent asthma in the past for which she has been maintained on albuterol nebulizer and as needed budesonide nebulizers.  He is here to transfer care and I will be continuing his nebulizers as he had previously used given they have worked well for him.  I have suggested to his mother that we add as needed Symbicort to his medication regimen to provide as needed LABA/ICS coverage.  I will obtain pulmonary function testing to assess for any signs of obstruction given that diagnosis of reactive airway disease in the past.  I do note spirometry's in the past at [facility] and I suspect that he would be able to comply with the test.  - albuterol (ACCUNEB) 0.63 MG/3ML nebulizer solution; Take 3 mLs (0.63 mg total) by nebulization every 6 (six) hours as needed for wheezing.  Dispense: 810 mL; Refill: 0 - budesonide (PULMICORT) 0.25 MG/2ML nebulizer solution; USE 2 ML(0.25 MG) VIA NEBULIZER DAILY  Dispense: 180 mL; Refill: 1 - budesonide-formoterol (SYMBICORT) 80-4.5 MCG/ACT inhaler; Inhale 2 puffs into the lungs every 4 (four) hours as needed.  Dispense: 1 each; Refill: 12 - Pulmonary Function Test [facility] Only; Future  #OSA (obstructive sleep apnea)  Previously seen at [facility] and had a sleep study noting obstructive sleep apnea.  He was unable to tolerate CPAP.  This was discussed at length in the past and his mom reports that is what has worked for him is nocturnal oxygen therapy.  There is a note that mentions a referral to [facility] dentistry to evaluate for a mouthpiece but this does not seem to have been done.  I recommend continuing current management.  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  On review of the medical record and care everywhere, I note a report of a CT scan of the chest from the year 2000 when he was 73 to 26 years old where the radiology report impression mentions bronchiolitis obliterans organizing pneumonia; I suspect this diagnosis has carried over since then.  His mother reports that this was in the setting of RSV.  I suggest removing this diagnosis from the patient's medical record as bronchiolitis obliterans is a completely different diagnosis that he does not have under the new characterization.  Return in about 6 months (around 08/28/2022).  I spent 60 minutes caring for this patient today, including preparing to see the patient, obtaining and/or reviewing separately obtained history, performing a medically appropriate examination and/or evaluation, counseling and educating the patient/family/caregiver, ordering medications, tests, or procedures, documenting clinical information in the electronic health record, and independently interpreting results (not separately reported/billed) and communicating results to the patient/family/caregiver  Kerry Reichert, MD [facility] 02/27/2022 2:38 PM    End of visit medications:  Meds ordered this encounter  Medications   albuterol (ACCUNEB) 0.63 MG/3ML nebulizer solution    Sig: Take 3 mLs (0.63 mg total) by nebulization every 6 (six) hours as needed for wheezing.    Dispense:  810 mL    Refill:  0    90 day rx per pt and family   budesonide (PULMICORT) 0.25 MG/2ML nebulizer solution    Sig: USE 2 ML(0.25  MG) VIA NEBULIZER DAILY    Dispense:  180 mL    Refill:  1   budesonide-formoterol (SYMBICORT) 80-4.5 MCG/ACT inhaler    Sig: Inhale 2 puffs into the lungs every 4 (four) hours as needed.    Dispense:  1 each    Refill:  12     Current Outpatient Medications:    Allopurinol 200  MG TABS, Take 1 tablet by mouth daily., Disp: 90 tablet, Rfl: 1   budesonide-formoterol (SYMBICORT) 80-4.5 MCG/ACT inhaler, Inhale 2 puffs into the lungs every 4 (four) hours as needed., Disp: 1 each, Rfl: 12   cetirizine (ZYRTEC) 10 MG tablet, TAKE 1 TABLET(10 MG) BY MOUTH DAILY, Disp: 90 tablet, Rfl: 1   clindamycin (CLEOCIN T) 1 % external solution, Apply to chest and back daily after shower for folliculitis., Disp: 60 mL, Rfl: 3   clobetasol ointment (TEMOVATE) 0.05 %, Apply to affected area knee and scalp twice daily until improved. Avoid face, groin, underarms., Disp: 60 g, Rfl: 2   DERMA-SMOOTHE/FS BODY 0.01 % OIL, Apply 1 application  topically as directed. Apply once to twice daily to scalp/body, do not rinse off, Disp: 118.28 mL, Rfl: 3   fluticasone (FLONASE) 50 MCG/ACT nasal spray, SHAKE LIQUID AND USE 2 SPRAYS IN EACH NOSTRIL DAILY, Disp: 48 g, Rfl: 1   ketoconazole (NIZORAL) 2 % shampoo, APPLY EXTERNALLY TO THE AFFECTED AREA 1-2 TIMES PER WEEK, LET SIT SEVERAL MINUTES UNTIL IMPROVED, Disp: 120 mL, Rfl: 0   omeprazole (PRILOSEC) 20 MG capsule, TAKE 1 CAPSULE(20 MG) BY MOUTH DAILY, Disp: 90 capsule, Rfl: 0   PROAIR HFA 108 (90 Base) MCG/ACT inhaler, SMARTSIG:1 Puff(s) By Mouth Every 6 Hours PRN., Disp: 6.7 g, Rfl: 1   tacrolimus (PROTOPIC) 0.1 % ointment, Apply twice a day to affected area groin until improved., Disp: 60 g, Rfl: 0   albuterol (ACCUNEB) 0.63 MG/3ML nebulizer solution, Take 3 mLs (0.63 mg total) by nebulization every 6 (six) hours as needed for wheezing., Disp: 810 mL, Rfl: 0   budesonide (PULMICORT) 0.25 MG/2ML nebulizer solution, USE 2 ML(0.25 MG) VIA NEBULIZER DAILY, Disp: 180 mL, Rfl: 1   Subjective:   PATIENT ID: Kerry Graves GENDER: male DOB: 05-13-1995, MRN: [id number]  Chief Complaint  Patient presents with   Follow-up    SOB with activity.     HPI  Kerry Graves is a pleasant 26 year old male with a history of trisomy 21 presenting to clinic  for that evaluation and work-up of shortness of breath.  The patient had been previously seen by Dr. Melvyn Novas on 06/22/2021 to establish care.  Transition of care is requested given Dr. Melvyn Novas will not be returning to [facility].  I obtained the history with the help from the patient's one-to-one as well as his mother over the phone.  They report that he is overall healthy and has minimal respiratory complaints but at times endorses some shortness of breath and wheezing especially when he comes down with an upper respiratory tract infection.  His mother, Mo, tells me that he was diagnosed with RSV at the age of 2 without associated complication of bronchiolitis obliterans organizing pneumonia (BOOP).  This is very helpful to clarify given his records included diagnosis of bronchiolitis obliterans without clarification of the origin.  They tell me that he uses his albuterol nebulizer once a day at night and has the budesonide as needed.  They used to be desonide whenever he gets ill with an upper respiratory tract infection and subsequently taper  it but have not had to use it recently.  The patient was previously followed by pediatric pulmonology at [facility] and was last seen there in 2018 where he was followed for reactive airway disease/asthma.  Patient's mother further tells me that he is able to use his inhaler but the technique is not perfect.  She notes that he has a large tongue and was given a diagnosis of obstructive sleep apnea for which they have him on 0.5 L/min of oxygen therapy at night when he sleeps.  He does not require oxygen therapy during the day and he is quite active.  He tells me that he is able to lift weights and exercise.  Ancillary information including prior medications, full medical/surgical/family/social histories, and PFTs (when available) are listed below and have been reviewed.   Review of Systems  Constitutional:  Negative for chills and fever.  Respiratory:  Negative for cough, sputum  production, shortness of breath and wheezing.   Cardiovascular:  Negative for chest pain and leg swelling.  Skin:  Negative for rash.     Objective:   Vitals:   02/27/22 1340  BP: 124/70  Pulse: 75  Temp: 98 F (36.7 C)  TempSrc: Temporal  SpO2: 95%  Weight: 206 lb 9.6 oz (93.7 kg)  Height: 5' (1.524 m)   95% on RA  BMI Readings from Last 3 Encounters:  02/27/22 40.35 kg/m  12/07/21 39.00 kg/m  06/22/21 38.55 kg/m   Wt Readings from Last 3 Encounters:  02/27/22 206 lb 9.6 oz (93.7 kg)  12/07/21 199 lb 11.2 oz (90.6 kg)  06/22/21 197 lb 6.4 oz (89.5 kg)    Physical Exam Constitutional:      General: He is not in acute distress.    Appearance: He is obese. He is not ill-appearing.  HENT:     Mouth/Throat:     Mouth: Mucous membranes are moist.  Eyes:     Pupils: Pupils are equal, round, and reactive to light.  Cardiovascular:     Rate and Rhythm: Normal rate and regular rhythm.  Pulmonary:     Effort: Pulmonary effort is normal.     Breath sounds: Normal breath sounds.  Abdominal:     General: Abdomen is flat.     Palpations: Abdomen is soft.  Skin:    General: Skin is warm.  Neurological:     General: Graves focal deficit present.     Mental Status: He is alert and oriented to person, place, and time. Mental status is at baseline.       Ancillary Information    Past Medical History:  Diagnosis Date   Allergy    Asthma    Blood transfusion without reported diagnosis    Oxygen deficiency    Sleep apnea      Family History  Problem Relation Age of Onset   Hypertension Mother      Past Surgical History:  Procedure Laterality Date   ASD REPAIR     LUNG BIOPSY      Social History   Socioeconomic History   Marital status: Single    Spouse name: Not on file   Number of children: Not on file   Years of education: Not on file   Highest education level: Not on file  Occupational History   Not on file  Tobacco Use   Smoking status: Never    Smokeless tobacco: Never  Substance and Sexual Activity   Alcohol use: Not Currently   Drug use: Yes  Sexual activity: Never  Other Topics Concern   Not on file  Social History Narrative   ** Merged History Encounter **       Social Determinants of Health   Financial Resource Strain: Not on file  Food Insecurity: Not on file  Transportation Needs: Not on file  Physical Activity: Not on file  Stress: Not on file  Social Connections: Not on file  Intimate Partner Violence: Not on file     Graves Known Allergies   CBC    Component Value Date/Time   WBC 4.9 12/06/2021 0932   WBC 7.1 12/03/2014 1035   RBC 5.32 12/06/2021 0932   RBC 5.41 12/03/2014 1035   HGB 17.2 12/06/2021 0932   HCT 49.1 12/06/2021 0932   PLT 195 12/06/2021 0932   MCV 92 12/06/2021 0932   MCV 93 08/14/2012 1322   MCH 32.3 12/06/2021 0932   MCH 30.8 12/03/2014 1035   MCHC 35.0 12/06/2021 0932   MCHC 33.2 12/03/2014 1035   RDW 13.7 12/06/2021 0932   RDW 14.4 08/14/2012 1322   LYMPHSABS 2.3 12/06/2021 0932   LYMPHSABS 3.4 08/14/2012 1322   MONOABS 0.6 08/14/2012 1322   EOSABS 0.0 12/06/2021 0932   EOSABS 0.0 08/14/2012 1322   BASOSABS 0.0 12/06/2021 0932   BASOSABS 0.0 08/14/2012 1322    Pulmonary Functions Testing Results:     Graves data to display          Outpatient Medications Prior to Visit  Medication Sig Dispense Refill   Allopurinol 200 MG TABS Take 1 tablet by mouth daily. 90 tablet 1   cetirizine (ZYRTEC) 10 MG tablet TAKE 1 TABLET(10 MG) BY MOUTH DAILY 90 tablet 1   clindamycin (CLEOCIN T) 1 % external solution Apply to chest and back daily after shower for folliculitis. 60 mL 3   clobetasol ointment (TEMOVATE) 0.05 % Apply to affected area knee and scalp twice daily until improved. Avoid face, groin, underarms. 60 g 2   DERMA-SMOOTHE/FS BODY 0.01 % OIL Apply 1 application  topically as directed. Apply once to twice daily to scalp/body, do not rinse off 118.28 mL 3   fluticasone  (FLONASE) 50 MCG/ACT nasal spray SHAKE LIQUID AND USE 2 SPRAYS IN EACH NOSTRIL DAILY 48 g 1   ketoconazole (NIZORAL) 2 % shampoo APPLY EXTERNALLY TO THE AFFECTED AREA 1-2 TIMES PER WEEK, LET SIT SEVERAL MINUTES UNTIL IMPROVED 120 mL 0   omeprazole (PRILOSEC) 20 MG capsule TAKE 1 CAPSULE(20 MG) BY MOUTH DAILY 90 capsule 0   PROAIR HFA 108 (90 Base) MCG/ACT inhaler SMARTSIG:1 Puff(s) By Mouth Every 6 Hours PRN. 6.7 g 1   tacrolimus (PROTOPIC) 0.1 % ointment Apply twice a day to affected area groin until improved. 60 g 0   albuterol (ACCUNEB) 0.63 MG/3ML nebulizer solution Take 3 mLs (0.63 mg total) by nebulization every 6 (six) hours as needed for wheezing. 810 mL 0   budesonide (PULMICORT) 0.25 MG/2ML nebulizer solution USE 2 ML(0.25 MG) VIA NEBULIZER DAILY (Patient not taking: Reported on 02/27/2022) 180 mL 1   Graves facility-administered medications prior to visit.

## 2022-03-13 ENCOUNTER — Other Ambulatory Visit: Payer: Self-pay | Admitting: Physician Assistant

## 2022-03-13 DIAGNOSIS — M1A9XX0 Chronic gout, unspecified, without tophus (tophi): Secondary | ICD-10-CM

## 2022-04-18 ENCOUNTER — Other Ambulatory Visit (HOSPITAL_COMMUNITY): Payer: Self-pay

## 2022-04-19 ENCOUNTER — Other Ambulatory Visit (HOSPITAL_COMMUNITY): Payer: Self-pay

## 2022-05-21 ENCOUNTER — Other Ambulatory Visit: Payer: Self-pay | Admitting: Physician Assistant

## 2022-05-21 DIAGNOSIS — K219 Gastro-esophageal reflux disease without esophagitis: Secondary | ICD-10-CM

## 2022-05-28 ENCOUNTER — Other Ambulatory Visit: Payer: Self-pay | Admitting: Physician Assistant

## 2022-06-11 ENCOUNTER — Ambulatory Visit (INDEPENDENT_AMBULATORY_CARE_PROVIDER_SITE_OTHER): Payer: Medicaid Other | Admitting: Physician Assistant

## 2022-06-11 ENCOUNTER — Encounter: Payer: Self-pay | Admitting: Physician Assistant

## 2022-06-11 VITALS — BP 100/61 | HR 75 | Wt 214.7 lb

## 2022-06-11 DIAGNOSIS — R7303 Prediabetes: Secondary | ICD-10-CM

## 2022-06-11 DIAGNOSIS — L409 Psoriasis, unspecified: Secondary | ICD-10-CM | POA: Diagnosis not present

## 2022-06-11 DIAGNOSIS — M1A9XX0 Chronic gout, unspecified, without tophus (tophi): Secondary | ICD-10-CM | POA: Diagnosis not present

## 2022-06-11 NOTE — Assessment & Plan Note (Signed)
Will repeat a1c

## 2022-06-11 NOTE — Assessment & Plan Note (Signed)
Of body and scalp Refer back to derm

## 2022-06-11 NOTE — Assessment & Plan Note (Signed)
No flares, continue allopurinol, repeat uric acid level for stability

## 2022-06-11 NOTE — Progress Notes (Signed)
I,Sha'taria Tyson,acting as a Education administrator for [employer], PA-C.,have documented all relevant documentation on the behalf of Mikey Kirschner, PA-C,as directed by  Mikey Kirschner, PA-C while in the presence of Mikey Kirschner, PA-C.   Established patient visit   Patient: Kerry Graves   DOB: [birth year]   27 y.o. Male  MRN: [id number] Visit Date: 06/11/2022  Today's healthcare provider: Mikey Kirschner, PA-C   Cc. Predm and gout f/u  Subjective    HPI   Gout -Pt denies recent flares. He is consistent with medication  Prediabetes -Pt attempts to eat a balanced diet.   Scalp condition -Pt reports with step father today, he states this never improves despite treatment. Medications: Outpatient Medications Prior to Visit  Medication Sig   Allopurinol 200 MG TABS TAKE 1 TABLET BY MOUTH DAILY   budesonide (PULMICORT) 0.25 MG/2ML nebulizer solution USE 2 ML(0.25 MG) VIA NEBULIZER DAILY   budesonide-formoterol (SYMBICORT) 80-4.5 MCG/ACT inhaler Inhale 2 puffs into the lungs every 4 (four) hours as needed.   cetirizine (ZYRTEC) 10 MG tablet TAKE 1 TABLET(10 MG) BY MOUTH DAILY   clindamycin (CLEOCIN T) 1 % external solution Apply to chest and back daily after shower for folliculitis.   clobetasol ointment (TEMOVATE) 0.05 % Apply to affected area knee and scalp twice daily until improved. Avoid face, groin, underarms.   DERMA-SMOOTHE/FS BODY 0.01 % OIL Apply 1 application  topically as directed. Apply once to twice daily to scalp/body, do not rinse off   fluticasone (FLONASE) 50 MCG/ACT nasal spray SHAKE LIQUID AND USE 2 SPRAYS IN EACH NOSTRIL DAILY   ketoconazole (NIZORAL) 2 % shampoo APPLY EXTERNALLY TO THE AFFECTED AREA 1-2 TIMES PER WEEK, LET SIT SEVERAL MINUTES UNTIL IMPROVED   omeprazole (PRILOSEC) 20 MG capsule TAKE 1 CAPSULE(20 MG) BY MOUTH DAILY   PROAIR HFA 108 (90 Base) MCG/ACT inhaler SMARTSIG:1 Puff(s) By Mouth Every 6 Hours PRN.   tacrolimus (PROTOPIC) 0.1 % ointment  Apply twice a day to affected area groin until improved.   albuterol (ACCUNEB) 0.63 MG/3ML nebulizer solution Take 3 mLs (0.63 mg total) by nebulization every 6 (six) hours as needed for wheezing.   No facility-administered medications prior to visit.    Review of Systems  Constitutional:  Negative for fatigue and fever.  Respiratory:  Negative for cough and shortness of breath.   Cardiovascular:  Negative for chest pain, palpitations and leg swelling.  Neurological:  Negative for dizziness and headaches.      Objective    BP 100/61 (BP Location: Left Arm, Patient Position: Sitting, Cuff Size: Large)   Pulse 75   Wt 214 lb 11.2 oz (97.4 kg)   SpO2 100%   BMI 41.93 kg/m  BP Readings from Last 3 Encounters:  06/11/22 100/61  02/27/22 124/70  12/07/21 100/87      Physical Exam Constitutional:      General: He is awake.     Appearance: He is well-developed.  HENT:     Head: Normocephalic.  Eyes:     Conjunctiva/sclera: Conjunctivae normal.  Cardiovascular:     Rate and Rhythm: Normal rate and regular rhythm.     Heart sounds: Normal heart sounds.  Pulmonary:     Effort: Pulmonary effort is normal.     Breath sounds: Normal breath sounds.  Skin:    General: Skin is warm.  Neurological:     Mental Status: He is alert and oriented to person, place, and time.  Psychiatric:  Attention and Perception: Attention normal.        Mood and Affect: Mood normal.        Speech: Speech normal.        Behavior: Behavior is cooperative.      No results found for any visits on 06/11/22.  Assessment & Plan     Problem List Items Addressed This Visit       Musculoskeletal and Integument   Psoriasis    Of body and scalp Refer back to derm        Other   Gout    No flares, continue allopurinol, repeat uric acid level for stability      Relevant Orders   Uric acid   Comprehensive Metabolic Panel (CMET)   Prediabetes - Primary    Will repeat a1c      Relevant  Orders   HgB A1c     Return in about 6 months (around 12/10/2022) for CPE.     I, Mikey Kirschner, PA-C have reviewed all documentation for this visit. The documentation on  06/11/22 for the exam, diagnosis, procedures, and orders are all accurate and complete.  Mikey Kirschner, PA-C [facility] [address] [place], [place], [address] Office: [phone] Fax: [place]

## 2022-06-12 LAB — COMPREHENSIVE METABOLIC PANEL
ALT: 29 IU/L (ref 0–44)
AST: 22 IU/L (ref 0–40)
Albumin/Globulin Ratio: 1.4 (ref 1.2–2.2)
Albumin: 4.3 g/dL (ref 4.3–5.2)
Alkaline Phosphatase: 74 IU/L (ref 44–121)
BUN/Creatinine Ratio: 13 (ref 9–20)
BUN: 15 mg/dL (ref 6–20)
Bilirubin Total: 0.5 mg/dL (ref 0.0–1.2)
CO2: 25 mmol/L (ref 20–29)
Calcium: 8.9 mg/dL (ref 8.7–10.2)
Chloride: 100 mmol/L (ref 96–106)
Creatinine, Ser: 1.2 mg/dL (ref 0.76–1.27)
Globulin, Total: 3 g/dL (ref 1.5–4.5)
Glucose: 86 mg/dL (ref 70–99)
Potassium: 4.3 mmol/L (ref 3.5–5.2)
Sodium: 139 mmol/L (ref 134–144)
Total Protein: 7.3 g/dL (ref 6.0–8.5)
eGFR: 86 mL/min/{1.73_m2} (ref >59)

## 2022-06-12 LAB — URIC ACID: Uric Acid: 8.3 mg/dL (ref 3.8–8.4)

## 2022-06-12 LAB — HEMOGLOBIN A1C
Est. average glucose Bld gHb Est-mCnc: 120 mg/dL
Hgb A1c MFr Bld: 5.8 % — ABNORMAL HIGH (ref 4.8–5.6)

## 2022-07-25 ENCOUNTER — Ambulatory Visit (INDEPENDENT_AMBULATORY_CARE_PROVIDER_SITE_OTHER): Payer: Medicaid Other | Admitting: Dermatology

## 2022-07-25 VITALS — BP 98/83

## 2022-07-25 DIAGNOSIS — Z7189 Other specified counseling: Secondary | ICD-10-CM | POA: Diagnosis not present

## 2022-07-25 DIAGNOSIS — Z79899 Other long term (current) drug therapy: Secondary | ICD-10-CM | POA: Diagnosis not present

## 2022-07-25 DIAGNOSIS — L409 Psoriasis, unspecified: Secondary | ICD-10-CM | POA: Diagnosis not present

## 2022-07-25 NOTE — Patient Instructions (Signed)
Due to recent changes in healthcare laws, you may see results of your pathology and/or laboratory studies on MyChart before the doctors have had a chance to review them. We understand that in some cases there may be results that are confusing or concerning to you. Please understand that not all results are received at the same time and often the doctors may need to interpret multiple results in order to provide you with the best plan of care or course of treatment. Therefore, we ask that you please give us 2 business days to thoroughly review all your results before contacting the office for clarification. Should we see a critical lab result, you will be contacted sooner.   If You Need Anything After Your Visit  If you have any questions or concerns for your doctor, please call our main line at [phone] and press option 4 to reach your doctor's medical assistant. If no one answers, please leave a voicemail as directed and we will return your call as soon as possible. Messages left after 4 pm will be answered the following business day.   You may also send us a message via MyChart. We typically respond to MyChart messages within 1-2 business days.  For prescription refills, please ask your pharmacy to contact our office. Our fax number is [phone].  If you have an urgent issue when the clinic is closed that cannot wait until the next business day, you can page your doctor at the number below.    Please note that while we do our best to be available for urgent issues outside of office hours, we are not available 24/7.   If you have an urgent issue and are unable to reach us, you may choose to seek medical care at your doctor's office, retail clinic, urgent care center, or emergency room.  If you have a medical emergency, please immediately call 911 or go to the emergency department.  Pager Numbers  - Dr. Kowalski: [phone]  - Dr. Moye: [phone]  - Dr. Stewart:  [phone]  In the event of inclement weather, please call our main line at [phone] for an update on the status of any delays or closures.  Dermatology Medication Tips: Please keep the boxes that topical medications come in in order to help keep track of the instructions about where and how to use these. Pharmacies typically print the medication instructions only on the boxes and not directly on the medication tubes.   If your medication is too expensive, please contact our office at [phone] option 4 or send us a message through MyChart.   We are unable to tell what your co-pay for medications will be in advance as this is different depending on your insurance coverage. However, we may be able to find a substitute medication at lower cost or fill out paperwork to get insurance to cover a needed medication.   If a prior authorization is required to get your medication covered by your insurance company, please allow us 1-2 business days to complete this process.  Drug prices often vary depending on where the prescription is filled and some pharmacies may offer cheaper prices.  The website www.goodrx.com contains coupons for medications through different pharmacies. The prices here do not account for what the cost may be with help from insurance (it may be cheaper with your insurance), but the website can give you the price if you did not use any insurance.  - You can print the associated coupon and take it with   your prescription to the pharmacy.  - You may also stop by our office during regular business hours and pick up a GoodRx coupon card.  - If you need your prescription sent electronically to a different pharmacy, notify our office through [facility] MyChart or by phone at [phone] option 4.     Si Usted Necesita Algo Despus de Su Visita  Tambin puede enviarnos un mensaje a travs de MyChart. Por lo general respondemos a los mensajes de MyChart en el transcurso de 1 a 2  das hbiles.  Para renovar recetas, por favor pida a su farmacia que se ponga en contacto con nuestra oficina. Nuestro nmero de fax es el [phone].  Si tiene un asunto urgente cuando la clnica est cerrada y que no puede esperar hasta el siguiente da hbil, puede llamar/localizar a su doctor(a) al nmero que aparece a continuacin.   Por favor, tenga en cuenta que aunque hacemos todo lo posible para estar disponibles para asuntos urgentes fuera del horario de oficina, no estamos disponibles las 24 horas del da, los 7 das de la semana.   Si tiene un problema urgente y no puede comunicarse con nosotros, puede optar por buscar atencin mdica  en el consultorio de su doctor(a), en una clnica privada, en un centro de atencin urgente o en una sala de emergencias.  Si tiene una emergencia mdica, por favor llame inmediatamente al 911 o vaya a la sala de emergencias.  Nmeros de bper  - Dr. Kowalski: [phone]  - Dra. Moye: [phone]  - Dra. Stewart: [phone]  En caso de inclemencias del tiempo, por favor llame a nuestra lnea principal al [phone] para una actualizacin sobre el estado de cualquier retraso o cierre.  Consejos para la medicacin en dermatologa: Por favor, guarde las cajas en las que vienen los medicamentos de uso tpico para ayudarle a seguir las instrucciones sobre dnde y cmo usarlos. Las farmacias generalmente imprimen las instrucciones del medicamento slo en las cajas y no directamente en los tubos del medicamento.   Si su medicamento es muy caro, por favor, pngase en contacto con nuestra oficina llamando al [phone] y presione la opcin 4 o envenos un mensaje a travs de MyChart.   No podemos decirle cul ser su copago por los medicamentos por adelantado ya que esto es diferente dependiendo de la cobertura de su seguro. Sin embargo, es posible que podamos encontrar un medicamento sustituto a menor costo o llenar un formulario para que el  seguro cubra el medicamento que se considera necesario.   Si se requiere una autorizacin previa para que su compaa de seguros cubra su medicamento, por favor permtanos de 1 a 2 das hbiles para completar este proceso.  Los precios de los medicamentos varan con frecuencia dependiendo del lugar de dnde se surte la receta y alguna farmacias pueden ofrecer precios ms baratos.  El sitio web www.goodrx.com tiene cupones para medicamentos de diferentes farmacias. Los precios aqu no tienen en cuenta lo que podra costar con la ayuda del seguro (puede ser ms barato con su seguro), pero el sitio web puede darle el precio si no utiliz ningn seguro.  - Puede imprimir el cupn correspondiente y llevarlo con su receta a la farmacia.  - Tambin puede pasar por nuestra oficina durante el horario de atencin regular y recoger una tarjeta de cupones de GoodRx.  - Si necesita que su receta se enve electrnicamente a una farmacia diferente, informe a nuestra oficina a travs de MyChart de [place]   o por telfono llamando al [phone] y presione la opcin 4.  

## 2022-07-25 NOTE — Progress Notes (Signed)
   Follow-Up Visit   Subjective  Kerry Graves is a 27 y.o. male who presents for the following: Psoriasis Accompanied by mother.  Psoriasis not doing well and they would like to pursue more aggressive treatment. The patient is mentally challenged and his mother is present with her and she does all the talking.  The following portions of the chart were reviewed this encounter and updated as appropriate: medications, allergies, medical history  Review of Systems:  No other skin or systemic complaints except as noted in HPI or Assessment and Plan.  Objective  Well appearing patient in no apparent distress; mood and affect are within normal limits. Areas Examined: Scalp Relevant exam findings are noted in the Assessment and Plan.     Assessment & Plan   PSORIASIS Severe Well-demarcated erythematous papules/plaques with silvery scale, guttate pink scaly papules.  Chronic and persistent condition with duration or expected duration over one year. Condition is symptomatic/ bothersome to patient. Not currently at goal.  Discussed treatment options - Otezla vs biologic. Advised patient and his mother that [facility] will not be covered by his insurance until he tries and fails a biologic. He had a CMP drawn 1 month ago. Ordered other biologic labs today.  Treatment Plan: Will plan to start Cosentyx pending labs. Will start paperwork and sent prescription once we have lab results.  Counseling on psoriasis and coordination of care  psoriasis is a chronic non-curable, but treatable genetic/hereditary disease that may have other systemic features affecting other organ systems such as joints (Psoriatic Arthritis). It is associated with an increased risk of inflammatory bowel disease, heart disease, non-alcoholic fatty liver disease, and depression.  Treatments include light and laser treatments; topical medications; and systemic medications including oral and  injectables.  Psoriasis  Related Procedures CBC with Differential/Platelet Hepatitis B surface antibody,qualitative Hepatitis B surface antigen Hepatitis C antibody QuantiFERON-TB Gold Plus Hepatitis B core antibody, total  Related Medications clobetasol ointment (TEMOVATE) 0.05 % Apply to affected area knee and scalp twice daily until improved. Avoid face, groin, underarms.  ketoconazole (NIZORAL) 2 % shampoo APPLY EXTERNALLY TO THE AFFECTED AREA 1-2 TIMES PER WEEK, LET SIT SEVERAL MINUTES UNTIL IMPROVED  DERMA-SMOOTHE/FS BODY 0.01 % OIL Apply 1 application  topically as directed. Apply once to twice daily to scalp/body, do not rinse off  tacrolimus (PROTOPIC) 0.1 % ointment Apply twice a day to affected area groin until improved.  Return pending labs.  I, Joanie Coddington, CMA, am acting as scribe for Armida Sans, MD .  Documentation: I have reviewed the above documentation for accuracy and completeness, and I agree with the above.  Armida Sans, MD

## 2022-08-01 ENCOUNTER — Other Ambulatory Visit
Admission: RE | Admit: 2022-08-01 | Discharge: 2022-08-01 | Disposition: A | Discharge status: Home / Self Care | Payer: Medicaid Other | Attending: Dermatology | Admitting: Dermatology

## 2022-08-01 DIAGNOSIS — L409 Psoriasis, unspecified: Secondary | ICD-10-CM | POA: Diagnosis present

## 2022-08-01 LAB — CBC WITH DIFFERENTIAL/PLATELET
Abs Immature Granulocytes: 0.01 10*3/uL (ref 0.00–0.07)
Basophils Absolute: 0 10*3/uL (ref 0.0–0.1)
Basophils Relative: 0 %
Eosinophils Absolute: 0 10*3/uL (ref 0.0–0.5)
Eosinophils Relative: 0 %
HCT: 50.3 % (ref 39.0–52.0)
Hemoglobin: 16.9 g/dL (ref 13.0–17.0)
Immature Granulocytes: 0 %
Lymphocytes Relative: 42 %
Lymphs Abs: 2.1 10*3/uL (ref 0.7–4.0)
MCH: 31.1 pg (ref 26.0–34.0)
MCHC: 33.6 g/dL (ref 30.0–36.0)
MCV: 92.6 fL (ref 80.0–100.0)
Monocytes Absolute: 0.5 10*3/uL (ref 0.1–1.0)
Monocytes Relative: 10 %
Neutro Abs: 2.3 10*3/uL (ref 1.7–7.7)
Neutrophils Relative %: 48 %
Platelets: 217 10*3/uL (ref 150–400)
RBC: 5.43 MIL/uL (ref 4.22–5.81)
RDW: 14.8 % (ref 11.5–15.5)
WBC: 5 10*3/uL (ref 4.0–10.5)
nRBC: 0 % (ref 0.0–0.2)

## 2022-08-01 LAB — HEPATITIS C ANTIBODY: HCV Ab: NONREACTIVE

## 2022-08-01 LAB — HEPATITIS B CORE ANTIBODY, TOTAL: Hep B Core Total Ab: NONREACTIVE

## 2022-08-01 LAB — HEPATITIS B SURFACE ANTIGEN: Hepatitis B Surface Ag: NONREACTIVE

## 2022-08-02 LAB — HEPATITIS B SURFACE ANTIBODY, QUANTITATIVE: Hep B S AB Quant (Post): <3.5 m[IU]/mL — ABNORMAL LOW (ref >9.9)

## 2022-08-04 LAB — QUANTIFERON-TB GOLD PLUS (RQFGPL)
QuantiFERON Mitogen Value: >10 IU/mL
QuantiFERON Nil Value: 0.05 IU/mL
QuantiFERON TB1 Ag Value: 0.06 IU/mL
QuantiFERON TB2 Ag Value: 0.06 IU/mL

## 2022-08-04 LAB — QUANTIFERON-TB GOLD PLUS: QuantiFERON-TB Gold Plus: NEGATIVE

## 2022-08-06 ENCOUNTER — Telehealth: Payer: Self-pay

## 2022-08-06 NOTE — Telephone Encounter (Signed)
Unable to leave message as voice mail is full.

## 2022-08-06 NOTE — Telephone Encounter (Signed)
-----   Message from David C Kowalski, MD sent at 08/06/2022 12:57 PM EDT ----- Labs from 08/01/2022 show: Blood counts / CBC is normal Hepatitis B Surface Ag is Non-reactive Hep B core Ab non-reactive Hepatitis B Surface Antibody is low  (please advise pt and caregivers he may should get Hepatitis B vaccine by his PCP) Hepatitis C Ab non-reactive TB test / Quantiferon gold = Negative (Recent Chem metabolic panel was OK)  Pt may start Cosentyx for Psoriasis Please advise pt and caregiver (mother) of above and send in Cosentyx May set up 1st  loading doses with me when Cosentyx approved. 

## 2022-08-07 ENCOUNTER — Telehealth: Payer: Self-pay

## 2022-08-07 DIAGNOSIS — L409 Psoriasis, unspecified: Secondary | ICD-10-CM

## 2022-08-07 MED ORDER — COSENTYX UNOREADY 300 MG/2ML ~~LOC~~ SOAJ
2.0000 mL | SUBCUTANEOUS | 0 refills | Status: AC
Start: 2022-08-07 — End: ?

## 2022-08-07 MED ORDER — COSENTYX UNOREADY 300 MG/2ML ~~LOC~~ SOAJ
2.0000 mL | SUBCUTANEOUS | 2 refills | Status: AC
Start: 2022-08-07 — End: ?

## 2022-08-07 NOTE — Telephone Encounter (Signed)
-----   Message from Deirdre Evener, MD sent at 08/06/2022 12:57 PM EDT ----- Labs from 08/01/2022 show: Blood counts / CBC is normal Hepatitis B Surface Ag is Non-reactive Hep B core Ab non-reactive Hepatitis B Surface Antibody is low  (please advise pt and caregivers he may should get Hepatitis B vaccine by his PCP) Hepatitis C Ab non-reactive TB test / Quantiferon gold = Negative (Recent Chem metabolic panel was OK)  Pt may start Cosentyx for Psoriasis Please advise pt and caregiver (mother) of above and send in Cosentyx May set up 1st  loading doses with me when Cosentyx approved.

## 2022-08-07 NOTE — Telephone Encounter (Signed)
Spoke with patient's mother regarding labs results. Advised her to discuss Hepatitis B vaccine with his PCP. Sent  Cosentyx prescription to [facility] and pending approval will call back to schedule appointment for injections/hd

## 2022-08-12 ENCOUNTER — Encounter: Payer: Self-pay | Admitting: Dermatology

## 2022-09-11 ENCOUNTER — Other Ambulatory Visit: Payer: Self-pay | Admitting: Physician Assistant

## 2022-09-11 DIAGNOSIS — K219 Gastro-esophageal reflux disease without esophagitis: Secondary | ICD-10-CM

## 2022-09-17 ENCOUNTER — Other Ambulatory Visit: Payer: Self-pay | Admitting: Physician Assistant

## 2022-09-17 DIAGNOSIS — M1A9XX0 Chronic gout, unspecified, without tophus (tophi): Secondary | ICD-10-CM

## 2022-09-17 NOTE — Telephone Encounter (Signed)
Medication Refill - Medication: Pt says medication for gout, unsure of the name. Says they believe the pharmacy has submitted a request.   Has the patient contacted their pharmacy? Yes.   (Agent: If no, request that the patient contact the pharmacy for the refill. If patient does not wish to contact the pharmacy document the reason why and proceed with request.) (Agent: If yes, when and what did the pharmacy advise?)  Preferred Pharmacy (with phone number or street name):  [id number] - [place], [place] - [address] AT [facility] [address] [facility] [address]  [address] [place] [address]  Phone: [phone] Fax: [phone]   Has the patient been seen for an appointment in the last year OR does the patient have an upcoming appointment? Yes.    Agent: Please be advised that RX refills may take up to 3 business days. We ask that you follow-up with your pharmacy.

## 2022-09-18 MED ORDER — ALLOPURINOL 200 MG PO TABS
1.0000 | ORAL_TABLET | Freq: Every day | ORAL | 0 refills | Status: DC
Start: 2022-09-18 — End: 2022-12-12
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
## 2022-10-09 NOTE — Telephone Encounter (Signed)
Kerry Graves called saying the pharmacy [facility] has been faxing for PA for this prescription but has not gotten a response. They are sending a PA again today.  Pt needs prescription asap.  CB#  [phone]

## 2022-10-10 ENCOUNTER — Telehealth: Payer: Self-pay | Admitting: Physician Assistant

## 2022-10-10 NOTE — Telephone Encounter (Signed)
[facility] pharmacy is requesting prescription refill Allopurinol 200 MG TABS Please advise

## 2022-10-12 NOTE — Telephone Encounter (Signed)
Rx sent 09/18/22 #90 Requesting refill too soon

## 2022-10-29 ENCOUNTER — Telehealth: Payer: Self-pay | Admitting: Physician Assistant

## 2022-10-29 NOTE — Telephone Encounter (Signed)
[facility] pharmacy is requesting prior authorization Key: [id number] Name: Mian Allopurinol 200 mg tablets has been rejected and requires PA

## 2022-11-26 ENCOUNTER — Telehealth: Payer: Self-pay | Admitting: Physician Assistant

## 2022-11-26 NOTE — Telephone Encounter (Signed)
[facility] pharmacy is requesting prior authorization Key: [id number] Name: Rudnik Allopurinol 200 mg tablets has been rejected and requires PA

## 2022-11-28 NOTE — Telephone Encounter (Signed)
PA initiated

## 2022-11-29 NOTE — Telephone Encounter (Signed)
[facility] pharmacy and spoke with Brittney to let her know medication was approved.

## 2022-11-29 NOTE — Telephone Encounter (Signed)
Outcome Approved today by [employer] 2017 Your request has been approved Authorization Expiration Date: 11/28/2023 Drug Allopurinol 200MG  tablets

## 2022-12-03 ENCOUNTER — Ambulatory Visit: Payer: Self-pay | Admitting: *Deleted

## 2022-12-03 NOTE — Telephone Encounter (Signed)
Second attempted to contact patient's mother- left message to call office

## 2022-12-03 NOTE — Telephone Encounter (Signed)
Summary: Ankle swelling   Pt's mother called reporting that the patient has ankle swelling  Gout flare up, no gout medication for 3 months because there was a delay in med refill and prior auth.  Best contact: [phone]       Attempted to call mother- no answer- message left to call office

## 2022-12-03 NOTE — Telephone Encounter (Signed)
Patient's mom called, left VM to return the call to the office to speak to the NT. 3rd attempt   Summary: Ankle swelling   Pt's mother called reporting that the patient has ankle swelling  Gout flare up, no gout medication for 3 months because there was a delay in med refill and prior auth.  Best contact: [phone]

## 2022-12-03 NOTE — Telephone Encounter (Signed)
Per his chart, he needs labs to be updated and needs to be seen. Please, let him schedule an appt

## 2022-12-05 ENCOUNTER — Ambulatory Visit (INDEPENDENT_AMBULATORY_CARE_PROVIDER_SITE_OTHER): Payer: MEDICAID | Admitting: Physician Assistant

## 2022-12-05 VITALS — BP 97/65 | HR 80 | Temp 98.0°F

## 2022-12-05 DIAGNOSIS — M25471 Effusion, right ankle: Secondary | ICD-10-CM | POA: Diagnosis not present

## 2022-12-06 NOTE — Progress Notes (Signed)
New patient visit  Patient: Kerry Graves   DOB: 06-14-1995   26 y.o. Male  MRN: [id number] Visit Date: 12/05/2022  Today's healthcare provider: Debera Lat, PA-C   Chief Complaint  Patient presents with   Gout    Patient has been out of medication for 3 months waiting for someone to do something about the prior authorization.  Mother was not told by his rehab specialist that he was out of medication.   Subjective    Kerry Graves is a 27 y.o. male who presents today as a new patient to establish care.   Discussed the use of AI scribe software for clinical note transcription with the patient, who gave verbal consent to proceed.  History of Present Illness   The patient, with a known history of gout, presents with a swollen foot that has been causing difficulty in walking for over a week. The patient's mother reports that the patient's gout medication ran out three months ago, but this was not communicated to him by the pharmacy. The patient has been bedridden due to the pain and swelling. The patient denies any recent injury or fall that could have caused the swelling. The patient's mother expresses concern about the delay in medication and the impact on his health.     Main historian is pt's mother. 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   FYI last pulm visit there was no sign of this condition   Oxygen deficiency    Sleep apnea    Past Surgical History:  Procedure Laterality Date   ASD REPAIR     LUNG BIOPSY     Family Status  Relation Name Status   Mother  (Not Specified)  No partnership data on file   Family History  Problem Relation Age of Onset   Hypertension Mother    Social History   Socioeconomic History    Marital status: Single    Spouse name: Not on file   Number of children: Not on file   Years of education: Not on file   Highest education level: Not on file  Occupational History   Not on file  Tobacco Use   Smoking status: Never   Smokeless tobacco: Never  Substance and Sexual Activity   Alcohol use: Not Currently   Drug use: Yes   Sexual activity: Never  Other Topics Concern   Not on file  Social History Narrative   ** Merged History Encounter **       Social Determinants of Health   Financial Resource Strain: Not on file  Food Insecurity: Not on file  Transportation Needs: Not on file  Physical Activity: Not on file  Stress: Not on file  Social Connections: Not on file   Outpatient Medications Prior to Visit  Medication Sig   Allopurinol 200 MG TABS Take 1 tablet by mouth daily.   budesonide (PULMICORT) 0.25 MG/2ML nebulizer solution USE 2 ML(0.25 MG) VIA NEBULIZER DAILY   budesonide-formoterol (SYMBICORT) 80-4.5 MCG/ACT inhaler Inhale 2 puffs into the lungs every 4 (four) hours as needed.   cetirizine (ZYRTEC) 10 MG tablet TAKE 1 TABLET(10 MG) BY MOUTH DAILY   clindamycin (CLEOCIN T) 1 % external solution Apply to chest and back daily after  shower for folliculitis.   clobetasol ointment (TEMOVATE) 0.05 % Apply to affected area knee and scalp twice daily until improved. Avoid face, groin, underarms.   DERMA-SMOOTHE/FS BODY 0.01 % OIL Apply 1 application  topically as directed. Apply once to twice daily to scalp/body, do not rinse off   fluticasone (FLONASE) 50 MCG/ACT nasal spray SHAKE LIQUID AND USE 2 SPRAYS IN EACH NOSTRIL DAILY   ketoconazole (NIZORAL) 2 % shampoo APPLY EXTERNALLY TO THE AFFECTED AREA 1-2 TIMES PER WEEK, LET SIT SEVERAL MINUTES UNTIL IMPROVED   omeprazole (PRILOSEC) 20 MG capsule TAKE 1 CAPSULE(20 MG) BY MOUTH DAILY   PROAIR HFA 108 (90 Base) MCG/ACT inhaler SMARTSIG:1 Puff(s) By Mouth Every 6 Hours PRN.   Secukinumab (COSENTYX UNOREADY) 300 MG/2ML  SOAJ Inject 2 mLs into the skin as directed. Inject 2 ml week 0, 1, 2, 3 and 4   Secukinumab (COSENTYX UNOREADY) 300 MG/2ML SOAJ Inject 2 mLs into the skin every 30 (thirty) days.   tacrolimus (PROTOPIC) 0.1 % ointment Apply twice a day to affected area groin until improved.   albuterol (ACCUNEB) 0.63 MG/3ML nebulizer solution Take 3 mLs (0.63 mg total) by nebulization every 6 (six) hours as needed for wheezing.   No facility-administered medications prior to visit.   No Known Allergies  Immunization History  Administered Date(s) Administered   Influenza,inj,Quad PF,6+ Mos 02/21/2022   Moderna Sars-Covid-2 Vaccination 04/16/2019, 05/15/2019    Health Maintenance  Topic Date Due   HPV VACCINES (1 - Male 3-dose series) Never done   DTaP/Tdap/Td (1 - Tdap) Never done   COVID-19 Vaccine (3 - Moderna risk series) 06/12/2019   INFLUENZA VACCINE  07/15/2023 (Originally 11/15/2022)   Hepatitis C Screening  Completed   HIV Screening  Completed    Patient Care Team: Debera Lat, PA-C as PCP - General (Physician Assistant)  Review of Systems  All other systems reviewed and are negative.  Except see HPI       Objective    BP 97/65 (BP Location: Right Arm, Patient Position: Sitting, Cuff Size: Normal)   Pulse 80   Temp 98 F (36.7 C) (Oral)   SpO2 97%     Physical Exam Constitutional:      General: He is not in acute distress.    Appearance: Normal appearance. He is obese. He is not diaphoretic.  HENT:     Head: Normocephalic.  Eyes:     Conjunctiva/sclera: Conjunctivae normal.  Pulmonary:     Effort: Pulmonary effort is normal. No respiratory distress.  Musculoskeletal:        General: Swelling (mild, right lateral ankle and dorsum of the right foot) and tenderness (right ankle) present. No deformity or signs of injury.  Skin:    Findings: No bruising, erythema or rash.  Neurological:     Mental Status: He is alert and oriented to person, place, and time. Mental  status is at baseline.     Motor: No weakness.     Coordination: Coordination normal.     Gait: Gait normal.     Depression Screen    12/07/2021    2:41 PM 03/01/2020   12:03 PM  PHQ 2/9 Scores  PHQ - 2 Score 0 0  PHQ- 9 Score 2    No results found for any visits on 12/05/22.  Assessment & Plan        Ankle swelling and tenderness Could be due to gout vs injury Hx of gout Flare up in the foot, with mild swelling  and pain. Patient has been off medication for three months. Unclear if injury may have contributed to current symptoms. -Check kidney function and uric acid levels tomorrow morning. -Continue gout medication as previously prescribed after lab results. -Advised symptomatic treatment: ice/heat, rise, compression and OTC pain medications: tylenol   Possible Ankle Injury Swelling and pain in the ankle, with limited mobility. Differential includes gout flare vs injury. -Observe for improvement or worsening of symptoms. Will xr if symptoms worsen  In the setting of trisomy 21.  General Health Maintenance -Ensure consistent use of gout medication to prevent future flare-ups.     Encounter to establish care Welcomed to our clinic Reviewed past medical hx, social hx, family hx and surgical hx Pt advised to send all vaccination records or screening   No follow-ups on file.   The patient was advised to call back or seek an in-person evaluation if the symptoms worsen or if the condition fails to improve as anticipated.  I discussed the assessment and treatment plan with the patient. The patient was provided an opportunity to ask questions and all were answered. The patient agreed with the plan and demonstrated an understanding of the instructions.  I, Debera Lat, PA-C have reviewed all documentation for this visit. The documentation on  12/05/22  for the exam, diagnosis, procedures, and orders are all accurate and complete.  Debera Lat, [facility], MMS [facility] [phone] (phone) [phone] (fax)   [facility]

## 2022-12-07 ENCOUNTER — Encounter: Payer: Self-pay | Admitting: Physician Assistant

## 2022-12-07 DIAGNOSIS — M25471 Effusion, right ankle: Secondary | ICD-10-CM | POA: Insufficient documentation

## 2022-12-07 LAB — COMPREHENSIVE METABOLIC PANEL
ALT: 27 IU/L (ref 0–44)
AST: 25 IU/L (ref 0–40)
Albumin: 4 g/dL — ABNORMAL LOW (ref 4.3–5.2)
Alkaline Phosphatase: 76 IU/L (ref 44–121)
BUN/Creatinine Ratio: 9 (ref 9–20)
BUN: 12 mg/dL (ref 6–20)
Bilirubin Total: 0.7 mg/dL (ref 0.0–1.2)
CO2: 23 mmol/L (ref 20–29)
Calcium: 9.2 mg/dL (ref 8.7–10.2)
Chloride: 98 mmol/L (ref 96–106)
Creatinine, Ser: 1.32 mg/dL — ABNORMAL HIGH (ref 0.76–1.27)
Globulin, Total: 3.9 g/dL (ref 1.5–4.5)
Glucose: 92 mg/dL (ref 70–99)
Potassium: 4.1 mmol/L (ref 3.5–5.2)
Sodium: 138 mmol/L (ref 134–144)
Total Protein: 7.9 g/dL (ref 6.0–8.5)
eGFR: 76 mL/min/{1.73_m2} (ref >59)

## 2022-12-07 LAB — URIC ACID: Uric Acid: 6.7 mg/dL (ref 3.8–8.4)

## 2022-12-10 NOTE — Progress Notes (Signed)
Please, advise pt's mother that Kerry Graves has stable labs including uric acid of 6.7. Most likely it was not gout exacerbation. Continue taking allopurinol as was prescribed. Contact us for the next refill/5-7 days prior, please.

## 2022-12-11 ENCOUNTER — Telehealth: Payer: Self-pay | Admitting: *Deleted

## 2022-12-11 NOTE — Telephone Encounter (Signed)
Pt  caregiver on DPR , Kerry Graves, given lab results per notes of J Oswalt, PA from 12/10/22 on 12/11/22. Pt  caregiver did not verbalized understanding. Multiple questions regarding why Rx for allopurinol was previously not refilled due to prior authorizations and required multiple phone calls to get refilled. Caregiver reports she is not with patient now but will f/u patient has been following PCP instructions to take allopurinol as directed 200mg  daily. And elevated right foot/leg ice/ heat , compression and taking tylenol OTC if needed for pain control. Caregiver reports patient continues with right ankle swelling but will give more time and if swelling continues she will call back.

## 2022-12-12 ENCOUNTER — Other Ambulatory Visit: Payer: Self-pay | Admitting: Physician Assistant

## 2022-12-12 DIAGNOSIS — M1A9XX0 Chronic gout, unspecified, without tophus (tophi): Secondary | ICD-10-CM

## 2022-12-12 MED ORDER — ALLOPURINOL 200 MG PO TABS
1.0000 | ORAL_TABLET | Freq: Every day | ORAL | 1 refills | Status: DC
Start: 2022-12-12 — End: 2023-07-29

## 2022-12-27 ENCOUNTER — Other Ambulatory Visit: Payer: Self-pay | Admitting: Physician Assistant

## 2022-12-27 ENCOUNTER — Other Ambulatory Visit: Payer: Self-pay | Admitting: Student in an Organized Health Care Education/Training Program

## 2022-12-27 DIAGNOSIS — L409 Psoriasis, unspecified: Secondary | ICD-10-CM

## 2022-12-27 DIAGNOSIS — J4520 Mild intermittent asthma, uncomplicated: Secondary | ICD-10-CM

## 2022-12-28 NOTE — Telephone Encounter (Signed)
Requested medication (s) are due for refill today: yes  Requested medication (s) are on the active medication list: yes  Last refill:  12/08/22  Future visit scheduled: no  Notes to clinic:  Unable to refill per protocol, cannot delegate.      Requested Prescriptions  Pending Prescriptions Disp Refills   tacrolimus (PROTOPIC) 0.1 % ointment [Pharmacy Med Name: TACROLIMUS 0.1% OINTMENT 30GM] 60 g 0    Sig: APPLY TWICE DAILY TO AFFECTED AREA GROIN UNTIL IMPROVED     Not Delegated - Immunology: Immunosuppressive Agents - tacrolimus Failed - 12/27/2022  6:19 PM      Failed - This refill cannot be delegated      Passed - Valid encounter within last 12 months    Recent Outpatient Visits           3 weeks ago Right ankle swelling   [facility] [place], [place], PA-C   6 months ago Prediabetes   [facility] Alfredia Ferguson, PA-C   1 year ago Annual physical exam   [facility] Alfredia Ferguson, PA-C   1 year ago Upper respiratory tract infection, unspecified type   [facility] Alfredia Ferguson, PA-C   2 years ago Neck pain   [place] [facility] Chrismon, Jodell C[place], [place]             Not Delegated - Immunology: Immunosuppressive Agents (Topical) Failed - 12/27/2022  6:19 PM      Failed - This refill cannot be delegated      Passed - Valid encounter within last 12 months    Recent Outpatient Visits           3 weeks ago Right ankle swelling   [place] [facility] [place], [place], PA-C   6 months ago Prediabetes   [facility]ia Ferguson, PA-C   1 year ago Annual physical exam   [place] [facility] Alfredia Ferguson, PA-C   1 year ago Upper respiratory tract infection, unspecified type   [facility] Alfredia Ferguson, PA-C   2 years ago Neck pain   [facility] Chrismon, Jodell [place], [place]

## 2023-01-02 ENCOUNTER — Telehealth: Payer: Self-pay | Admitting: Physician Assistant

## 2023-01-02 ENCOUNTER — Ambulatory Visit: Payer: Self-pay

## 2023-01-02 DIAGNOSIS — J4481 Bronchiolitis obliterans and bronchiolitis obliterans syndrome: Secondary | ICD-10-CM

## 2023-01-02 DIAGNOSIS — K219 Gastro-esophageal reflux disease without esophagitis: Secondary | ICD-10-CM

## 2023-01-02 DIAGNOSIS — L409 Psoriasis, unspecified: Secondary | ICD-10-CM

## 2023-01-02 DIAGNOSIS — J069 Acute upper respiratory infection, unspecified: Secondary | ICD-10-CM

## 2023-01-02 DIAGNOSIS — J4520 Mild intermittent asthma, uncomplicated: Secondary | ICD-10-CM

## 2023-01-02 NOTE — Telephone Encounter (Signed)
Message from Lennox Pippins sent at 01/02/2023  3:56 PM EDT  Summary: clarity on medications   Gavin Pound, patient's caretaker, has called and would like a return call to go over patients medications. She stated things have got messed up and she needs clarity on his medications.  Gavin Pound is on the [facility].   Deborah's callback # [phone]         Reason for Disposition  [1] Caller requesting a prescription renewal (no refills left), no triage required, AND [2] triager able to renew prescription per department policy  Answer Assessment - Initial Assessment Questions 1. DRUG NAME: "What medicine do you need to have refilled?"     Ketoconazole Albuterol inhaler Nizoral. Tacrolimus. Clobetazole, omeprazole, Derma smooth,Symbacort, cetirizine Pulmicort?  Protocols used: Medication Refill and Renewal Call-A-AH

## 2023-01-02 NOTE — Telephone Encounter (Signed)
Opened in error

## 2023-01-03 ENCOUNTER — Other Ambulatory Visit: Payer: Self-pay | Admitting: Physician Assistant

## 2023-01-03 DIAGNOSIS — K219 Gastro-esophageal reflux disease without esophagitis: Secondary | ICD-10-CM

## 2023-01-03 NOTE — Telephone Encounter (Signed)
Addendum for encounter from 01/02/23: Spoke with caregiver Gavin Pound and discussed meds that are needed to be refilled:  Pulmicort, Symbacort Cetrizine Clobetasol Dermasoothe Fluticasone Ketoconazole Omeprazole. Proair Tacrolimus Gavin Pound wants meds to go to [address]

## 2023-01-03 NOTE — Telephone Encounter (Signed)
Can you see if medications are correct?

## 2023-01-07 ENCOUNTER — Other Ambulatory Visit: Payer: Self-pay

## 2023-01-07 DIAGNOSIS — M1A9XX0 Chronic gout, unspecified, without tophus (tophi): Secondary | ICD-10-CM

## 2023-01-07 DIAGNOSIS — J069 Acute upper respiratory infection, unspecified: Secondary | ICD-10-CM

## 2023-01-07 DIAGNOSIS — K219 Gastro-esophageal reflux disease without esophagitis: Secondary | ICD-10-CM

## 2023-01-07 DIAGNOSIS — L409 Psoriasis, unspecified: Secondary | ICD-10-CM

## 2023-01-07 DIAGNOSIS — J4481 Bronchiolitis obliterans and bronchiolitis obliterans syndrome: Secondary | ICD-10-CM

## 2023-01-07 DIAGNOSIS — J4520 Mild intermittent asthma, uncomplicated: Secondary | ICD-10-CM

## 2023-01-09 ENCOUNTER — Telehealth: Payer: Self-pay | Admitting: Physician Assistant

## 2023-01-09 MED ORDER — DERMA-SMOOTHE/FS BODY 0.01 % EX OIL
1.0000 | TOPICAL_OIL | CUTANEOUS | 3 refills | Status: DC
Start: 2023-01-09 — End: 2024-02-04

## 2023-01-09 MED ORDER — TACROLIMUS 0.1 % EX OINT: TOPICAL_OINTMENT | CUTANEOUS | 0 refills | Status: DC

## 2023-01-09 MED ORDER — OMEPRAZOLE 20 MG PO CPDR: 20.0000 mg | DELAYED_RELEASE_CAPSULE | Freq: Every day | ORAL | 3 refills | Status: DC

## 2023-01-09 MED ORDER — CLOBETASOL PROPIONATE 0.05 % EX OINT: TOPICAL_OINTMENT | CUTANEOUS | 2 refills | Status: DC

## 2023-01-09 MED ORDER — FLUTICASONE PROPIONATE 50 MCG/ACT NA SUSP: 1.0000 | Freq: Every day | NASAL | 1 refills | Status: DC

## 2023-01-09 MED ORDER — CETIRIZINE HCL 10 MG PO TABS: 10.0000 mg | ORAL_TABLET | Freq: Every day | ORAL | 3 refills | Status: DC

## 2023-01-09 MED ORDER — KETOCONAZOLE 2 % EX SHAM: MEDICATED_SHAMPOO | CUTANEOUS | 0 refills | Status: DC

## 2023-01-09 NOTE — Telephone Encounter (Signed)
Received a fax from covermymeds for Tacrolimus 0.1% ointment  Key:

## 2023-01-10 NOTE — Telephone Encounter (Signed)
PA initiated

## 2023-01-11 NOTE — Telephone Encounter (Signed)
Approved. Approval letter under media tab

## 2023-02-08 ENCOUNTER — Other Ambulatory Visit: Payer: Self-pay | Admitting: Physician Assistant

## 2023-02-08 DIAGNOSIS — L409 Psoriasis, unspecified: Secondary | ICD-10-CM

## 2023-02-08 NOTE — Telephone Encounter (Signed)
Requested medication (s) are due for refill today: na   Requested medication (s) are on the active medication list: yes   Last refill:  01/09/23 #120 ml 0 refills - Ketoconazole, tacrolmious - 01/09/23 #60 0 refills   Future visit scheduled: no   Notes to clinic:   not delegated per protocol. Do you want to refill Rxs?     Requested Prescriptions  Pending Prescriptions Disp Refills   ketoconazole (NIZORAL) 2 % shampoo [Pharmacy Med Name: KETOCONAZOLE 2% SHAMPOO 120ML] 120 mL 0    Sig: APPLY EXTERNALLY TO AFFECTED AREA ONE TO TWO TIMES WEEKLY LET SIT SEVERAL MINUTES UNTIL IMPROVED     Not Delegated - Over the Counter: OTC 2 Failed - 02/08/2023  9:41 AM      Failed - This refill cannot be delegated      Passed - Valid encounter within last 12 months    Recent Outpatient Visits           2 months ago Right ankle swelling   [place] [facility] [place], [place], PA-C   8 months ago Prediabetes   [facility] Alfredia Ferguson, PA-C   1 year ago Annual physical exam   [place] [facility] Alfredia Ferguson, PA-C   1 year ago Upper respiratory tract infection, unspecified type   [facility] Alfredia Ferguson, PA-C   2 years ago Neck pain   [place] [place] [facility] Chrism[facility], Jodell [place], PA-C               tacrolimus (PROTOPIC) 0.1 % ointment [Pharmacy Med Name: TACROLIMUS 0.1% OINTMENT 30GM] 60 g 0    Sig: APPLY TWICE DAILY TO AFFECTED AREA GROIN UNTIL IMPROVED     Not Delegated - Immunology: Immunosuppressive Agents - tacrolimus Failed - 02/08/2023  9:41 AM      Failed - This refill cannot be delegated      Passed - Valid encounter within last 12 months    Recent Outpatient Visits           2 months ago Right ankle swelling   [place] [facility] [place], [place], PA-C   8 months ago Prediabetes   [facility] Alfredia Ferguson, PA-C   1  year ago Annual physical exam   [place] [facility] Alfredia Ferguson, PA-C   1 year ago Upper respiratory tract infection, unspecified type   [facility] Alfredia Ferguson, PA-C   2 years ago Neck pain   [facility] Chrismon, Jodell [place], [place]             Not Delegated - Immunology: Immunosuppressive Agents (Topical) Failed - 02/08/2023  9:41 AM      Failed - This refill cannot be delegated      Passed - Valid encounter within last 12 months    Recent Outpatient Visits           2 months ago Right ankle swelling   [place] [facility] [place], [place], PA-C   8 months ago Prediabetes   [facility] Alfredia Ferguson, PA-C   1 year ago Annual physical exam   [facility] Alfredia Ferguson, PA-C   1 year ago Upper respiratory tract infection, unspecified type   [place] Alfredia Ferguson, PA-C   2 years ago Neck pain    [facility]  Ch[place]ism[place], Jodell Cipro, PA-C

## 2023-03-16 ENCOUNTER — Other Ambulatory Visit: Payer: Self-pay | Admitting: Physician Assistant

## 2023-03-16 DIAGNOSIS — M1A9XX0 Chronic gout, unspecified, without tophus (tophi): Secondary | ICD-10-CM

## 2023-04-24 ENCOUNTER — Other Ambulatory Visit: Payer: Self-pay | Admitting: Physician Assistant

## 2023-04-24 DIAGNOSIS — L409 Psoriasis, unspecified: Secondary | ICD-10-CM

## 2023-04-25 NOTE — Telephone Encounter (Signed)
 Needs to schedule an appointment with dermatology for reassessment.

## 2023-05-06 ENCOUNTER — Other Ambulatory Visit: Payer: Self-pay | Admitting: Physician Assistant

## 2023-05-06 DIAGNOSIS — L409 Psoriasis, unspecified: Secondary | ICD-10-CM

## 2023-05-21 ENCOUNTER — Other Ambulatory Visit: Payer: Self-pay | Admitting: Physician Assistant

## 2023-05-21 DIAGNOSIS — L409 Psoriasis, unspecified: Secondary | ICD-10-CM

## 2023-05-21 NOTE — Telephone Encounter (Signed)
 Requested medication (s) are due for refill today - yes  Requested medication (s) are on the active medication list -yes  Future visit scheduled -no  Last refill: 01/09/23 60g 2RF  Notes to clinic: non delegated Rx  Requested Prescriptions  Pending Prescriptions Disp Refills   clobetasol  ointment (TEMOVATE ) 0.05 % [Pharmacy Med Name: CLOBETASOL  PROP 0.05% OINT 30GM] 30 g     Sig: Apply to affected area knee and scalp twice daily until improved. Avoid face, groin, underarms.     Not Delegated - Dermatology:  Corticosteroids Failed - 05/21/2023  8:44 AM      Failed - This refill cannot be delegated      Passed - Valid encounter within last 12 months    Recent Outpatient Visits           5 months ago Right ankle swelling   [place] [facility] [place], [place], [place]   11 months ago Prediabetes   [facility] Cyndi [place], PA-C   1 year ago Annual physical exam   [facility]di [place], PA-C   2 years ago Upper respiratory tract infection, unspecified type   [facility] Cyndi Shaver, PA-C   2 years ago Neck pain   [place] [facility] Chrismon, Marinda BRAVO, [place]                 Requested Prescriptions  Pending Prescriptions Disp Refills   clobetasol  ointment (TEMOVATE ) 0.05 % [Pharmacy Med Name: CLOBETASOL  PROP 0.05% OINT 30GM] 30 g     Sig: Apply to affected area knee and scalp twice daily until improved. Avoid face, groin, underarms.     Not Delegated - Dermatology:  Corticosteroids Failed - 05/21/2023  8:44 AM      Failed - This refill cannot be delegated      Passed - Valid encounter within last 12 months    Recent Outpatient Visits           5 months ago Right ankle swelling   [facility] [place], [place], [place]   11 months ago Prediabetes   [facility]di [facility], PA-C   1 year ago  Annual physical exam   [facility], [facility]-C   2 years ago Upper respiratory tract infection, unspecified type   [facility], PA-C   2 years ago Neck pain   [facility] Chrismon, Marinda BRAVO, PA-C

## 2023-07-29 ENCOUNTER — Telehealth: Payer: Self-pay | Admitting: Physician Assistant

## 2023-07-29 DIAGNOSIS — M1A9XX0 Chronic gout, unspecified, without tophus (tophi): Secondary | ICD-10-CM

## 2023-07-29 MED ORDER — ALLOPURINOL 200 MG PO TABS
1.0000 | ORAL_TABLET | Freq: Every day | ORAL | 1 refills | Status: AC
Start: 2023-07-29 — End: ?

## 2023-07-29 NOTE — Telephone Encounter (Signed)
 Received a fax from [facility] stating that Allopurinol 200 MG TABS  is unavailable in this dosage.  Send alternate?

## 2023-10-29 ENCOUNTER — Other Ambulatory Visit: Payer: Self-pay | Admitting: Physician Assistant

## 2023-10-29 DIAGNOSIS — K219 Gastro-esophageal reflux disease without esophagitis: Secondary | ICD-10-CM

## 2024-01-25 ENCOUNTER — Other Ambulatory Visit: Payer: Self-pay | Admitting: Physician Assistant

## 2024-01-25 DIAGNOSIS — J069 Acute upper respiratory infection, unspecified: Secondary | ICD-10-CM

## 2024-01-27 ENCOUNTER — Other Ambulatory Visit (HOSPITAL_COMMUNITY): Payer: Self-pay

## 2024-01-27 ENCOUNTER — Telehealth: Payer: Self-pay

## 2024-01-27 NOTE — Telephone Encounter (Signed)
 Pharmacy Patient Advocate Encounter  Received notification from [employer] [place] [employer] that Prior Authorization for Allopurinol  200MG  tablets has been APPROVED from 01/27/24 to 01/26/25   PA #/Case ID/Reference #: [id number]  Approval letter has been uploaded to media tab

## 2024-01-27 NOTE — Telephone Encounter (Signed)
 Pharmacy Patient Advocate Encounter   Received notification from Onbase that prior authorization for Allopurinol  200MG  tablets is required/requested.   Insurance verification completed.   The patient is insured through [employer] [place] [employer].   Per test claim: PA required; PA submitted to above mentioned insurance via Latent Key/confirmation #/EOC [id number] Status is pending

## 2024-01-28 ENCOUNTER — Telehealth: Payer: Self-pay | Admitting: Student in an Organized Health Care Education/Training Program

## 2024-01-28 ENCOUNTER — Other Ambulatory Visit: Payer: Self-pay | Admitting: Physician Assistant

## 2024-01-28 DIAGNOSIS — L409 Psoriasis, unspecified: Secondary | ICD-10-CM

## 2024-01-28 NOTE — Telephone Encounter (Signed)
 Pt needs a follow up with either Dr. Isadora or Izetta Rouleau to renew his nebulizer medication. Please let us  know so we can send in RX

## 2024-02-04 ENCOUNTER — Ambulatory Visit (INDEPENDENT_AMBULATORY_CARE_PROVIDER_SITE_OTHER): Payer: MEDICAID | Admitting: Physician Assistant

## 2024-02-04 VITALS — BP 112/65 | HR 92 | Resp 16 | Ht 60.0 in | Wt 208.7 lb

## 2024-02-04 DIAGNOSIS — E7849 Other hyperlipidemia: Secondary | ICD-10-CM

## 2024-02-04 DIAGNOSIS — Z23 Encounter for immunization: Secondary | ICD-10-CM

## 2024-02-04 DIAGNOSIS — J4520 Mild intermittent asthma, uncomplicated: Secondary | ICD-10-CM

## 2024-02-04 DIAGNOSIS — R7303 Prediabetes: Secondary | ICD-10-CM

## 2024-02-04 DIAGNOSIS — L409 Psoriasis, unspecified: Secondary | ICD-10-CM | POA: Diagnosis not present

## 2024-02-04 DIAGNOSIS — Z0000 Encounter for general adult medical examination without abnormal findings: Secondary | ICD-10-CM

## 2024-02-04 DIAGNOSIS — K219 Gastro-esophageal reflux disease without esophagitis: Secondary | ICD-10-CM | POA: Diagnosis not present

## 2024-02-04 DIAGNOSIS — L819 Disorder of pigmentation, unspecified: Secondary | ICD-10-CM

## 2024-02-04 DIAGNOSIS — J3089 Other allergic rhinitis: Secondary | ICD-10-CM | POA: Diagnosis not present

## 2024-02-04 DIAGNOSIS — M1A9XX0 Chronic gout, unspecified, without tophus (tophi): Secondary | ICD-10-CM

## 2024-02-04 DIAGNOSIS — J309 Allergic rhinitis, unspecified: Secondary | ICD-10-CM | POA: Insufficient documentation

## 2024-02-04 DIAGNOSIS — Z0001 Encounter for general adult medical examination with abnormal findings: Secondary | ICD-10-CM | POA: Diagnosis not present

## 2024-02-04 MED ORDER — KETOCONAZOLE 2 % EX SHAM: MEDICATED_SHAMPOO | CUTANEOUS | 0 refills | Status: AC

## 2024-02-04 MED ORDER — DERMA-SMOOTHE/FS BODY 0.01 % EX OIL: 1.0000 | TOPICAL_OIL | CUTANEOUS | 3 refills | Status: AC

## 2024-02-04 MED ORDER — OMEPRAZOLE 20 MG PO CPDR: 20.0000 mg | DELAYED_RELEASE_CAPSULE | Freq: Every day | ORAL | 3 refills | Status: AC

## 2024-02-04 MED ORDER — FLUTICASONE PROPIONATE 50 MCG/ACT NA SUSP: 1.0000 | Freq: Every day | NASAL | 1 refills | Status: AC

## 2024-02-04 NOTE — Progress Notes (Signed)
 Complete physical exam  Patient: Kerry Graves   DOB: 1995-09-15   28 y.o. Male  MRN: [id number] Visit Date: 02/04/2024  Today's healthcare provider: Jolynn Spencer, PA-C   Chief Complaint  Patient presents with   Annual Exam    Sleep pattern: sleeps good, 7-8 hours a night Exercise: walking No concerns   Subjective    Kerry Graves is a 28 y.o. male who presents today for a complete physical exam.   Discussed the use of AI scribe software for clinical note transcription with the patient, who gave verbal consent to proceed.  History of Present Illness Kerry Graves is a 28 year old male with psoriasis who presents for medication management and follow-up care.  Psoriasis affects his scalp, ears, and behind his ears. He applies Dermasmooth to these areas and his belly button. His condition worsens without this medication. There are challenges in obtaining prescribed biologic shots due to approval issues.  He is on allopurinol  for gout. Regular blood work is conducted to monitor hemoglobin, sugar, lipids, and uric acid levels due to previous borderline diabetes.  His caregiver and mother have noted pigmentation spots on his skin, prompting concern for regular dermatological evaluation.    Last depression screening scores    02/04/2024    2:19 PM 12/07/2021    2:41 PM 03/01/2020   12:03 PM  PHQ 2/9 Scores  PHQ - 2 Score  0 0  PHQ- 9 Score  2   Exception Documentation Medical reason     Last fall risk screening    12/07/2021    3:07 PM  Fall Risk   Falls in the past year? 1  Number falls in past yr: 0  Injury with Fall? 0  Risk for fall due to : History of fall(s)  Follow up Falls evaluation completed      Data saved with a previous flowsheet row definition   Last Audit-C alcohol use screening    03/01/2020   12:02 PM  Alcohol Use Disorder Test (AUDIT)  1. How often do you have a drink containing alcohol? 0  2. How many drinks containing  alcohol do you have on a typical day when you are drinking? 0  3. How often do you have six or more drinks on one occasion? 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   FYI last pulm visit there was no sign of this condition   Oxygen deficiency    Sleep apnea    Past Surgical History:  Procedure Laterality Date   ASD REPAIR     LUNG BIOPSY     Social History   Socioeconomic History   Marital status: Single    Spouse name: Not on file   Number of children: Not on file   Years of education: Not on file   Highest education level: Not on file  Occupational History   Not on file  Tobacco Use   Smoking status: Never   Smokeless tobacco: Never  Substance and  Sexual Activity   Alcohol use: Not Currently   Drug use: Yes   Sexual activity: Never  Other Topics Concern   Not on file  Social History Narrative   ** Merged History Encounter **       Social Drivers of Corporate investment banker Strain: Not on file  Food Insecurity: Not on file  Transportation Needs: Not on file  Physical Activity: Not on file  Stress: Not on file  Social Connections: Not on file  Intimate Partner Violence: Not on file   Family Status  Relation Name Status   Mother  (Not Specified)  No partnership data on file   Family History  Problem Relation Age of Onset   Hypertension Mother    No Known Allergies  Patient Care Team: Harmon Bommarito, PA-C as PCP - General (Physician Assistant)   Medications: Outpatient Medications Prior to Visit  Medication Sig   Allopurinol  200 MG TABS  Take 1 tablet by mouth daily.   cetirizine  (ZYRTEC ) 10 MG tablet Take 1 tablet (10 mg total) by mouth daily.   albuterol  (ACCUNEB ) 0.63 MG/3ML nebulizer solution TAKE BY NEBULIZATION EVERY 6 HOURS AS NEEDED FOR WHEEZING   budesonide  (PULMICORT ) 0.25 MG/2ML nebulizer solution USE 2 ML(0.25 MG) VIA NEBULIZER DAILY   budesonide -formoterol  (SYMBICORT ) 80-4.5 MCG/ACT inhaler Inhale 2 puffs into the lungs every 4 (four) hours as needed.   clindamycin  (CLEOCIN  T) 1 % external solution Apply to chest and back daily after shower for folliculitis.   clobetasol  ointment (TEMOVATE ) 0.05 % APPLY TO AFFECTED AREA KNEE AND SCALP TWICE DAILY UNTIL IMPROVED AVOID FACE, GROIN, UNDERARMS   PROAIR  HFA 108 (90 Base) MCG/ACT inhaler SMARTSIG:1 Puff(s) By Mouth Every 6 Hours PRN.   Secukinumab  (COSENTYX  UNOREADY) 300 MG/2ML SOAJ Inject 2 mLs into the skin as directed. Inject 2 ml week 0, 1, 2, 3 and 4   Secukinumab  (COSENTYX  UNOREADY) 300 MG/2ML SOAJ Inject 2 mLs into the skin every 30 (thirty) days.   [DISCONTINUED] DERMA-SMOOTHE /FS BODY 0.01 % OIL Apply 1 application  topically as directed. Apply once to twice daily to scalp/body, do not rinse off   [DISCONTINUED] fluticasone  (FLONASE ) 50 MCG/ACT nasal spray Place 1 spray into both nostrils daily.   [DISCONTINUED] ketoconazole  (NIZORAL ) 2 % shampoo APPLY EXTERNALLY TO AFFECTED AREA ONE TO TWO TIMES WEEKLY LET SIT SEVERAL MINUTES UNTIL IMPROVED   [DISCONTINUED] omeprazole  (PRILOSEC) 20 MG capsule TAKE 1 CAPSULE(20 MG) BY MOUTH DAILY   [DISCONTINUED] tacrolimus  (PROTOPIC ) 0.1 % ointment APPLY TWICE DAILY TO AFFECTED AREA GROIN UNTIL IMPROVED   No facility-administered medications prior to visit.    Review of Systems  All other systems reviewed and are negative.  Except see HPI     Objective    BP 112/65   Pulse 92   Resp 16   Ht 5' (1.524 m)   Wt 208 lb 11.2 oz (94.7 kg)   SpO2 97%   BMI 40.76 kg/m      Physical Exam Vitals reviewed.   Constitutional:      General: He is not in acute distress.    Appearance: Normal appearance. He is well-developed. He is not ill-appearing, toxic-appearing or diaphoretic.  HENT:     Head: Normocephalic and atraumatic.     Right Ear: Tympanic membrane, ear canal and external ear normal.     Left Ear: Tympanic membrane, ear canal and external ear normal.     Nose: Nose normal. No congestion or rhinorrhea.     Mouth/Throat:  Mouth: Mucous membranes are moist.     Pharynx: Oropharynx is clear. No oropharyngeal exudate.  Eyes:     General: No scleral icterus.       Right eye: No discharge.        Left eye: No discharge.     Conjunctiva/sclera: Conjunctivae normal.     Pupils: Pupils are equal, round, and reactive to light.  Neck:     Thyroid : No thyromegaly.     Vascular: No carotid bruit.  Cardiovascular:     Rate and Rhythm: Normal rate and regular rhythm.     Pulses: Normal pulses.     Heart sounds: Normal heart sounds. No murmur heard.    No friction rub. No gallop.  Pulmonary:     Effort: Pulmonary effort is normal. No respiratory distress.     Breath sounds: Normal breath sounds. No wheezing or rales.  Abdominal:     General: Abdomen is flat. Bowel sounds are normal. There is no distension.     Palpations: Abdomen is soft. There is no mass.     Tenderness: There is no abdominal tenderness. There is no right CVA tenderness, left CVA tenderness, guarding or rebound.     Hernia: No hernia is present.  Musculoskeletal:        General: No swelling, tenderness, deformity or signs of injury. Normal range of motion.     Cervical back: Normal range of motion and neck supple. No rigidity or tenderness.     Right lower leg: No edema.     Left lower leg: No edema.  Lymphadenopathy:     Cervical: No cervical adenopathy.  Skin:    General: Skin is warm and dry.     Coloration: Skin is not jaundiced or pale.     Findings: No bruising, erythema, lesion or rash.  Neurological:      Mental Status: He is alert and oriented to person, place, and time. Mental status is at baseline.     Gait: Gait normal.  Psychiatric:        Mood and Affect: Mood normal.        Behavior: Behavior normal.        Thought Content: Thought content normal.        Judgment: Judgment normal.      No results found for any visits on 02/04/24.  Assessment & Plan    Routine Health Maintenance and Physical Exam  Exercise Activities and Dietary recommendations  Goals   None     Immunization History  Administered Date(s) Administered   DTP 07/09/1996, 02/04/1997   DTaP 03/08/1997, 11/20/2000   Fluzone Influenza virus vaccine,trivalent (IIV3), split virus 04/30/2011   Hep B, Unspecified 1996/03/14, 07/09/1996, 03/08/1997   IPV 11/20/2000   Influenza, Seasonal, Injecte, Preservative Fre 02/04/2024   Influenza,inj,Quad PF,6+ Mos 01/26/2014, 02/21/2022   MMR 03/08/1997, 11/20/2000   Moderna Sars-Covid-2 Vaccination 04/16/2019, 05/15/2019, 12/03/2019   OPV 07/09/1996, 02/04/1997   PFIZER Comirnaty(Gray Top)Covid-19 Tri-Sucrose Vaccine 09/21/2020   Varicella 03/08/1997, 11/13/2005    Health Maintenance  Topic Date Due   DTaP/Tdap/Td vaccine (5 - Tdap) 02/19/2007   Pneumococcal Vaccine (1 of 2 - PCV) Never done   HPV Vaccine (1 - 3-dose SCDM series) Never done   COVID-19 Vaccine (5 - 2025-26 season) 12/16/2023   Flu Shot  Completed   Hepatitis C Screening  Completed   HIV Screening  Completed   Meningitis B Vaccine  Aged Out    Discussed health benefits of physical activity, and  encouraged him to engage in regular exercise appropriate for his age and condition.  Assessment & Plan Psoriasis Chronic psoriasis involving scalp, ears, and behind ears, requiring dermatological management. - Prescribe temporary supply of Dermasmooth for scalp, ears, and belly button. - Advise against applying strong topical steroids on face and groin. - Instruct to schedule appointment with  dermatologist for comprehensive management. Will follow-up  Skin pigmentation changes, needs evaluation for melanoma Presence of dark spots on palms, feet, and back, requiring evaluation to rule out melanoma. - Instruct to schedule appointment with dermatologist for evaluation of skin pigmentation changes. Will follow-up with dermatology  Gout Chronic Gout managed with allopurinol , requiring regular monitoring of uric acid levels. - Order lab work including uric acid levels to monitor gout management. Continue taking allopurinol  Continue lifestyle modifications Will follow-up  Hyperlipidemia Chronic and previously unstable Hyperlipidemia managed with rosuvastatin, requiring regular monitoring. - Continue rosuvastatin for hyperlipidemia management. - Order lipid panel to monitor cholesterol levels. Will follow-up  Prediabetes Borderline diabetes mellitus requiring regular monitoring of blood glucose levels. - Order lab work including hemoglobin A1c to monitor blood glucose levels. Last A1c was 5.7 Continue lifestyle modifications Will follow-up  General Health Maintenance Requires regular health maintenance including dental and eye examinations. - Instruct to schedule dental and eye examinations. - Order lab work including hemoglobin, sugar, lipids, and cholesterol checks.  Follow-up Requires regular follow-up with dermatology, pulmonology, and family medicine for comprehensive management. - Schedule follow-up with dermatology and pulmonology. - Schedule annual follow-up with family medicine.    Psoriasis  - DERMA-SMOOTHE /FS BODY 0.01 % OIL; Apply 1 application  topically as directed. Apply once to twice daily to scalp/body, do not rinse off  Dispense: 118.28 mL; Refill: 3 - ketoconazole  (NIZORAL ) 2 % shampoo; APPLY EXTERNALLY TO AFFECTED AREA ONE TO TWO TIMES WEEKLY LET SIT SEVERAL MINUTES UNTIL IMPROVED  Dispense: 120 mL; Refill: 0  Allergic rhinitis due to other  allergic trigger, unspecified seasonality  - fluticasone  (FLONASE ) 50 MCG/ACT nasal spray; Place 1 spray into both nostrils daily.  Dispense: 48 g; Refill: 1  Gastroesophageal reflux disease, unspecified whether esophagitis present Continue PPI/Nexium Elevate the head of the bed 6-8 inches, avoid recumbency for 3 hours after eating, avoid food as a delayed gastric emptying, weight loss   - omeprazole  (PRILOSEC) 20 MG capsule; Take 1 capsule (20 mg total) by mouth daily.  Dispense: 90 capsule; Refill: 3  Morbid obesity (HCC) (Primary) Chronic and stable Weight loss of 5% of pt's current weight via healthy diet and daily exercise encouraged. Reviewed labwork from 2023 - CBC with Differential/Platelet - Comprehensive metabolic panel with GFR - Hemoglobin A1c - Lipid panel - TSH Will reassess after  receiving lab results  Immunization due  - Flu vaccine trivalent PF, 6mos and older(Flulaval,Afluria,Fluarix,Fluzone)  Prediabetes  - Comprehensive metabolic panel with GFR - Hemoglobin A1c  Other hyperlipidemia  - Lipid panel   Chronic gout without tophus, unspecified cause, unspecified site  - Comprehensive metabolic panel with GFR Consider uric acid    Mild intermittent reactive airway disease without complication Chronic and stable Continue albuterol  and symbicort  inhalers Will follow-up  Annual physical exam Well adult exam without abnormal findings Things to do to keep yourself healthy  - Exercise at least 30-45 minutes a day, 3-4 days a week.  - Eat a low-fat diet with lots of fruits and vegetables, up to 7-9 servings per day.  - Seatbelts can save your life. Wear them always.  - Smoke detectors on every  level of your home, check batteries every year.  - Eye Doctor - have an eye exam every 1-2 years  - Safe sex - if you may be exposed to STDs, use a condom.  - Alcohol -  If you drink, do it moderately, less than 2 drinks per day.  - Health Care Power of Attorney.  Choose someone to speak for you if you are not able.  - Depression is common in our stressful world.If you're feeling down or losing interest in things you normally enjoy, please come in for a visit.  - Violence - If anyone is threatening or hurting you, please call immediately.   Return in about 1 year (around 02/03/2025) for CPE.    The patient was advised to call back or seek an in-person evaluation if the symptoms worsen or if the condition fails to improve as anticipated.  I discussed the assessment and treatment plan with the patient. The patient was provided an opportunity to ask questions and all were answered. The patient agreed with the plan and demonstrated an understanding of the instructions.  I, Damilola Flamm, PA-C have reviewed all documentation for this visit. The documentation on 02/04/2024  for the exam, diagnosis, procedures, and orders are all accurate and complete.  Jolynn Spencer, [facility], MMS [place] [phone] (phone) [phone] (fax)  [facility]

## 2024-02-05 ENCOUNTER — Other Ambulatory Visit: Payer: Self-pay | Admitting: Physician Assistant

## 2024-02-12 ENCOUNTER — Other Ambulatory Visit: Payer: Self-pay | Admitting: Student in an Organized Health Care Education/Training Program

## 2024-02-12 DIAGNOSIS — J4520 Mild intermittent asthma, uncomplicated: Secondary | ICD-10-CM

## 2024-02-12 DIAGNOSIS — J4481 Bronchiolitis obliterans and bronchiolitis obliterans syndrome: Secondary | ICD-10-CM

## 2024-02-12 NOTE — Telephone Encounter (Unsigned)
 Copied from CRM #[id number]. Topic: Clinical - Medication Refill >> Feb 12, 2024 10:37 AM Lavanda D wrote: Medication: albuterol  (ACCUNEB ) 0.63 MG/3ML nebulizer solution Patient's caregiver is unsure of exact names but said he is out of the nebulizer solution and his inhaler.  Has the patient contacted their pharmacy? No (Agent: If no, request that the patient contact the pharmacy for the refill. If patient does not wish to contact the pharmacy document the reason why and proceed with request.) (Agent: If yes, when and what did the pharmacy advise?)  This is the patient's preferred pharmacy:  [id number] - [place], [place] - [address] AT [facility] OF [address] [place] [address] Phone: [phone] Fax: [phone]  Is this the correct pharmacy for this prescription? Yes If no, delete pharmacy and type the correct one.   Has the prescription been filled recently? No  Is the patient out of the medication? Yes  Has the patient been seen for an appointment in the last year OR does the patient have an upcoming appointment? Yes upcoming appt 11/25  Can we respond through MyChart? No  Agent: Please be advised that Rx refills may take up to 3 business days. We ask that you follow-up with your pharmacy.

## 2024-02-13 ENCOUNTER — Ambulatory Visit: Payer: MEDICAID

## 2024-03-10 ENCOUNTER — Ambulatory Visit: Payer: MEDICAID | Admitting: Student in an Organized Health Care Education/Training Program

## 2024-03-16 ENCOUNTER — Ambulatory Visit: Payer: MEDICAID | Admitting: Student in an Organized Health Care Education/Training Program

## 2025-02-08 ENCOUNTER — Encounter: Payer: MEDICAID | Admitting: Physician Assistant
